# Patient Record
Sex: Male | Born: 1975 | Race: White | Hispanic: No | Marital: Single | Smoking: Current every day smoker
Health system: Southern US, Community
[De-identification: ages and names within clinical notes are randomized; demographics above are authoritative.]

## PROBLEM LIST (undated history)

## (undated) DIAGNOSIS — S73004A Unspecified dislocation of right hip, initial encounter: Secondary | ICD-10-CM

## (undated) DIAGNOSIS — F17200 Nicotine dependence, unspecified, uncomplicated: Secondary | ICD-10-CM

## (undated) DIAGNOSIS — S32461A Displaced associated transverse-posterior fracture of right acetabulum, initial encounter for closed fracture: Secondary | ICD-10-CM

## (undated) DIAGNOSIS — S82141A Displaced bicondylar fracture of right tibia, initial encounter for closed fracture: Secondary | ICD-10-CM

## (undated) DIAGNOSIS — I82451 Acute embolism and thrombosis of right peroneal vein: Secondary | ICD-10-CM

## (undated) HISTORY — PX: HEMORROIDECTOMY: SUR656

---

## 2001-10-10 ENCOUNTER — Encounter: Payer: Self-pay | Admitting: Emergency Medicine

## 2001-10-10 ENCOUNTER — Emergency Department (HOSPITAL_COMMUNITY): Admission: EM | Admit: 2001-10-10 | Discharge: 2001-10-10 | Payer: Self-pay | Admitting: Emergency Medicine

## 2001-10-13 ENCOUNTER — Emergency Department (HOSPITAL_COMMUNITY): Admission: EM | Admit: 2001-10-13 | Discharge: 2001-10-13 | Payer: Self-pay | Admitting: Internal Medicine

## 2001-10-18 ENCOUNTER — Emergency Department (HOSPITAL_COMMUNITY): Admission: EM | Admit: 2001-10-18 | Discharge: 2001-10-18 | Payer: Self-pay | Admitting: Emergency Medicine

## 2005-07-24 ENCOUNTER — Emergency Department (HOSPITAL_COMMUNITY): Admission: EM | Admit: 2005-07-24 | Discharge: 2005-07-24 | Payer: Self-pay | Admitting: Family Medicine

## 2011-07-27 ENCOUNTER — Emergency Department (HOSPITAL_COMMUNITY): Admission: EM | Admit: 2011-07-27 | Discharge: 2011-07-27 | Payer: Self-pay

## 2014-02-28 ENCOUNTER — Emergency Department (HOSPITAL_COMMUNITY)
Admission: EM | Admit: 2014-02-28 | Discharge: 2014-02-28 | Disposition: A | Discharge status: Home / Self Care | Payer: Worker's Compensation | Attending: Emergency Medicine | Admitting: Emergency Medicine

## 2014-02-28 ENCOUNTER — Encounter (HOSPITAL_COMMUNITY): Payer: Self-pay | Admitting: Emergency Medicine

## 2014-02-28 DIAGNOSIS — Z880 Allergy status to penicillin: Secondary | ICD-10-CM | POA: Diagnosis not present

## 2014-02-28 DIAGNOSIS — Z79899 Other long term (current) drug therapy: Secondary | ICD-10-CM | POA: Insufficient documentation

## 2014-02-28 DIAGNOSIS — T63441A Toxic effect of venom of bees, accidental (unintentional), initial encounter: Secondary | ICD-10-CM

## 2014-02-28 DIAGNOSIS — R0789 Other chest pain: Secondary | ICD-10-CM | POA: Insufficient documentation

## 2014-02-28 DIAGNOSIS — Y929 Unspecified place or not applicable: Secondary | ICD-10-CM | POA: Insufficient documentation

## 2014-02-28 DIAGNOSIS — T6391XA Toxic effect of contact with unspecified venomous animal, accidental (unintentional), initial encounter: Secondary | ICD-10-CM | POA: Insufficient documentation

## 2014-02-28 DIAGNOSIS — T63461A Toxic effect of venom of wasps, accidental (unintentional), initial encounter: Secondary | ICD-10-CM | POA: Diagnosis not present

## 2014-02-28 DIAGNOSIS — F17200 Nicotine dependence, unspecified, uncomplicated: Secondary | ICD-10-CM | POA: Insufficient documentation

## 2014-02-28 DIAGNOSIS — Y9389 Activity, other specified: Secondary | ICD-10-CM | POA: Insufficient documentation

## 2014-02-28 MED ORDER — PREDNISONE 50 MG PO TABS: 50.0000 mg | ORAL_TABLET | Freq: Every day | ORAL | Status: DC

## 2014-02-28 MED ORDER — FAMOTIDINE IN NACL 20-0.9 MG/50ML-% IV SOLN
20.0000 mg | Freq: Once | INTRAVENOUS | Status: AC
  Administered 2014-02-28: 20 mg via INTRAVENOUS
  Filled 2014-02-28: qty 50

## 2014-02-28 MED ORDER — ALBUTEROL SULFATE (2.5 MG/3ML) 0.083% IN NEBU
5.0000 mg | INHALATION_SOLUTION | Freq: Once | RESPIRATORY_TRACT | Status: AC
  Administered 2014-02-28: 5 mg via RESPIRATORY_TRACT
  Filled 2014-02-28: qty 6

## 2014-02-28 MED ORDER — METHYLPREDNISOLONE SODIUM SUCC 125 MG IJ SOLR
125.0000 mg | Freq: Once | INTRAMUSCULAR | Status: AC
  Administered 2014-02-28: 125 mg via INTRAVENOUS
  Filled 2014-02-28: qty 2

## 2014-02-28 MED ORDER — DIPHENHYDRAMINE HCL 50 MG/ML IJ SOLN
25.0000 mg | Freq: Once | INTRAMUSCULAR | Status: AC
  Administered 2014-02-28: 25 mg via INTRAVENOUS
  Filled 2014-02-28: qty 1

## 2014-02-28 MED ORDER — EPINEPHRINE 0.3 MG/0.3ML IJ SOAJ: 0.3000 mg | INTRAMUSCULAR | Status: AC | PRN

## 2014-02-28 MED ORDER — SODIUM CHLORIDE 0.9 % IV BOLUS (SEPSIS)
1000.0000 mL | Freq: Once | INTRAVENOUS | Status: AC
  Administered 2014-02-28: 1000 mL via INTRAVENOUS

## 2014-02-28 NOTE — ED Notes (Signed)
Pt was on the lawn mower and stung by a BEE. Pt used epi pen. sts he feels his chest and throat swelling up.

## 2014-02-28 NOTE — Discharge Instructions (Signed)
Take prednisone for 4 days.   Take benadryl 50 mg every 6 hrs as needed for itchiness.   Carry epi pen with you at all times. Use it when you feel that your throat closes up or you have trouble breathing.   Follow up with your doctor.   Return to ER if you have trouble breathing, throat closing, worse rash, fever.

## 2014-02-28 NOTE — ED Provider Notes (Signed)
CSN: [id number]     Arrival date & time 02/28/14  1113 History   First MD Initiated Contact with Patient 02/28/14 1120     Chief Complaint  Patient presents with  . Allergic Reaction     (Consider location/radiation/quality/duration/timing/severity/associated sxs/prior Treatment) The history is provided by the patient.  Daniel Franco Group is a 38 y.o. male here with possible anaphylaxis. He was mowing his lawn and was stung by some bees around 10:20 AM. He immediately felt that his throat was swelling up and he has chest tightness and develop a rash. He gave himself a shot of EpiPen right away. Now he feels better. Still feels some chest tightness but denies any trouble speaking or throat swelling.    History reviewed. No pertinent past medical history. History reviewed. No pertinent past surgical history. History reviewed. No pertinent family history. History  Substance Use Topics  . Smoking status: Current Every Day Smoker  . Smokeless tobacco: Not on file  . Alcohol Use: Not on file    Review of Systems  Respiratory: Positive for chest tightness.   All other systems reviewed and are negative.     Allergies  Bee venom and Penicillins  Home Medications   Prior to Admission medications   Medication Sig Start Date End Date Taking? Authorizing Provider  EPINEPHrine 0.3 mg/0.3 mL IJ SOAJ injection Inject 0.3 mg into the muscle once as needed (for allergic reaction).   Yes Historical Provider, MD  ranitidine (ZANTAC) 150 MG tablet Take 150 mg by mouth daily.   Yes Historical Provider, MD   BP 127/59  Pulse 73  Temp(Src) 97.5 F (36.4 C)  Resp 15  Wt 235 lb (106.595 kg)  SpO2 98% Physical Exam  Nursing note and vitals reviewed. Constitutional: He is oriented to person, place, and time. He appears well-nourished.  Slightly anxious   HENT:  Head: Normocephalic.  Mouth/Throat: Oropharynx is clear and moist.  Eyes: Conjunctivae are normal. Pupils are equal, round, and reactive  to light.  Neck: Normal range of motion. Neck supple.  No stridor   Cardiovascular: Normal rate, regular rhythm and normal heart sounds.   Pulmonary/Chest: Effort normal and breath sounds normal. No respiratory distress. He has no wheezes. He has no rales.  Abdominal: Soft. Bowel sounds are normal. He exhibits no distension. There is no tenderness. There is no rebound and no guarding.  Musculoskeletal: Normal range of motion. He exhibits no edema and no tenderness.  Neurological: He is alert and oriented to person, place, and time. No cranial nerve deficit. Coordination normal.  Skin: Skin is warm and dry.  Psychiatric: He has a normal mood and affect. His behavior is normal. Judgment and thought content normal.    ED Course  Procedures (including critical care time) Labs Review Labs Reviewed - No data to display  Imaging Review No results found.   EKG Interpretation None      MDM   Final diagnoses:  None    Daniel Franco Rambert is a 38 y.o. male here with possible anaphylaxis. No stridor and airway clear currently. Will observe for several hours, give steroids, benadryl.   2 PM  Observed for about 4 hrs from epi injection. No stridor. Feels better. No wheezing. Rash improved. Will refill epi pen and d/c home with course of steroids.    Richardean Canalavid H Yao, MD 02/28/14 [id number]

## 2017-12-19 ENCOUNTER — Emergency Department (HOSPITAL_COMMUNITY): Payer: 59

## 2017-12-19 ENCOUNTER — Other Ambulatory Visit: Payer: Self-pay

## 2017-12-19 ENCOUNTER — Inpatient Hospital Stay (HOSPITAL_COMMUNITY)
Admission: EM | Admit: 2017-12-19 | Discharge: 2017-12-28 | DRG: 493 | Disposition: A | Discharge status: Home Health | Payer: 59 | Attending: Orthopedic Surgery | Admitting: Orthopedic Surgery

## 2017-12-19 ENCOUNTER — Encounter (HOSPITAL_COMMUNITY): Payer: Self-pay | Admitting: *Deleted

## 2017-12-19 DIAGNOSIS — Z6829 Body mass index (BMI) 29.0-29.9, adult: Secondary | ICD-10-CM

## 2017-12-19 DIAGNOSIS — S32461A Displaced associated transverse-posterior fracture of right acetabulum, initial encounter for closed fracture: Principal | ICD-10-CM | POA: Diagnosis present

## 2017-12-19 DIAGNOSIS — Z880 Allergy status to penicillin: Secondary | ICD-10-CM

## 2017-12-19 DIAGNOSIS — S73004A Unspecified dislocation of right hip, initial encounter: Secondary | ICD-10-CM | POA: Diagnosis present

## 2017-12-19 DIAGNOSIS — S32401A Unspecified fracture of right acetabulum, initial encounter for closed fracture: Secondary | ICD-10-CM

## 2017-12-19 DIAGNOSIS — Y905 Blood alcohol level of 100-119 mg/100 ml: Secondary | ICD-10-CM | POA: Diagnosis present

## 2017-12-19 DIAGNOSIS — R059 Cough, unspecified: Secondary | ICD-10-CM

## 2017-12-19 DIAGNOSIS — F17200 Nicotine dependence, unspecified, uncomplicated: Secondary | ICD-10-CM | POA: Diagnosis present

## 2017-12-19 DIAGNOSIS — R05 Cough: Secondary | ICD-10-CM | POA: Diagnosis present

## 2017-12-19 DIAGNOSIS — D62 Acute posthemorrhagic anemia: Secondary | ICD-10-CM | POA: Diagnosis present

## 2017-12-19 DIAGNOSIS — F10929 Alcohol use, unspecified with intoxication, unspecified: Secondary | ICD-10-CM | POA: Diagnosis present

## 2017-12-19 DIAGNOSIS — S82144A Nondisplaced bicondylar fracture of right tibia, initial encounter for closed fracture: Secondary | ICD-10-CM | POA: Diagnosis present

## 2017-12-19 DIAGNOSIS — I82451 Acute embolism and thrombosis of right peroneal vein: Secondary | ICD-10-CM

## 2017-12-19 DIAGNOSIS — F17210 Nicotine dependence, cigarettes, uncomplicated: Secondary | ICD-10-CM | POA: Diagnosis present

## 2017-12-19 DIAGNOSIS — F10129 Alcohol abuse with intoxication, unspecified: Secondary | ICD-10-CM | POA: Diagnosis present

## 2017-12-19 DIAGNOSIS — I82491 Acute embolism and thrombosis of other specified deep vein of right lower extremity: Secondary | ICD-10-CM | POA: Diagnosis present

## 2017-12-19 DIAGNOSIS — Z419 Encounter for procedure for purposes other than remedying health state, unspecified: Secondary | ICD-10-CM

## 2017-12-19 DIAGNOSIS — Z91030 Bee allergy status: Secondary | ICD-10-CM

## 2017-12-19 DIAGNOSIS — T148XXA Other injury of unspecified body region, initial encounter: Secondary | ICD-10-CM

## 2017-12-19 DIAGNOSIS — S82141A Displaced bicondylar fracture of right tibia, initial encounter for closed fracture: Secondary | ICD-10-CM | POA: Diagnosis present

## 2017-12-19 DIAGNOSIS — S6992XA Unspecified injury of left wrist, hand and finger(s), initial encounter: Secondary | ICD-10-CM

## 2017-12-19 DIAGNOSIS — M25061 Hemarthrosis, right knee: Secondary | ICD-10-CM | POA: Diagnosis present

## 2017-12-19 HISTORY — DX: Unspecified dislocation of right hip, initial encounter: S73.004A

## 2017-12-19 HISTORY — DX: Displaced associated transverse-posterior fracture of right acetabulum, initial encounter for closed fracture: S32.461A

## 2017-12-19 HISTORY — DX: Acute embolism and thrombosis of right peroneal vein: I82.451

## 2017-12-19 HISTORY — DX: Displaced bicondylar fracture of right tibia, initial encounter for closed fracture: S82.141A

## 2017-12-19 HISTORY — DX: Nicotine dependence, unspecified, uncomplicated: F17.200

## 2017-12-19 LAB — COMPREHENSIVE METABOLIC PANEL
ALBUMIN: 4 g/dL (ref 3.5–5.0)
ALK PHOS: 55 U/L (ref 38–126)
ALT: 27 U/L (ref 17–63)
AST: 29 U/L (ref 15–41)
Anion gap: 13 (ref 5–15)
BILIRUBIN TOTAL: 0.6 mg/dL (ref 0.3–1.2)
BUN: 19 mg/dL (ref 6–20)
CO2: 22 mmol/L (ref 22–32)
Calcium: 9.3 mg/dL (ref 8.9–10.3)
Chloride: 104 mmol/L (ref 101–111)
Creatinine, Ser: 1.14 mg/dL (ref 0.61–1.24)
GFR calc non Af Amer: >60 mL/min (ref >60)
Glucose, Bld: 112 mg/dL — ABNORMAL HIGH (ref 65–99)
POTASSIUM: 3.4 mmol/L — AB (ref 3.5–5.1)
SODIUM: 139 mmol/L (ref 135–145)
Total Protein: 6.5 g/dL (ref 6.5–8.1)

## 2017-12-19 LAB — I-STAT CHEM 8, ED
BUN: 19 mg/dL (ref 6–20)
CHLORIDE: 102 mmol/L (ref 101–111)
Calcium, Ion: 1.17 mmol/L (ref 1.15–1.40)
Creatinine, Ser: 1.1 mg/dL (ref 0.61–1.24)
GLUCOSE: 107 mg/dL — AB (ref 65–99)
HCT: 43 % (ref 39.0–52.0)
Hemoglobin: 14.6 g/dL (ref 13.0–17.0)
Potassium: 3.2 mmol/L — ABNORMAL LOW (ref 3.5–5.1)
SODIUM: 141 mmol/L (ref 135–145)
TCO2: 23 mmol/L (ref 22–32)

## 2017-12-19 LAB — CBC
HCT: 42.9 % (ref 39.0–52.0)
HEMOGLOBIN: 14.6 g/dL (ref 13.0–17.0)
MCH: 31.7 pg (ref 26.0–34.0)
MCHC: 34 g/dL (ref 30.0–36.0)
MCV: 93.1 fL (ref 78.0–100.0)
Platelets: 326 10*3/uL (ref 150–400)
RBC: 4.61 MIL/uL (ref 4.22–5.81)
RDW: 12.9 % (ref 11.5–15.5)
WBC: 19.4 10*3/uL — ABNORMAL HIGH (ref 4.0–10.5)

## 2017-12-19 LAB — I-STAT CG4 LACTIC ACID, ED: Lactic Acid, Venous: 3.17 mmol/L (ref 0.5–1.9)

## 2017-12-19 LAB — ETHANOL: Alcohol, Ethyl (B): 104 mg/dL — ABNORMAL HIGH (ref <10)

## 2017-12-19 LAB — CDS SEROLOGY

## 2017-12-19 LAB — SAMPLE TO BLOOD BANK

## 2017-12-19 LAB — PROTIME-INR
INR: 0.94
PROTHROMBIN TIME: 12.5 s (ref 11.4–15.2)

## 2017-12-19 MED ORDER — HYDROMORPHONE HCL 2 MG/ML IJ SOLN: 1.0000 mg | Freq: Once | INTRAMUSCULAR | Status: DC

## 2017-12-19 MED ORDER — KETAMINE HCL 10 MG/ML IJ SOLN
INTRAMUSCULAR | Status: AC | PRN
  Administered 2017-12-19: 100 mg via INTRAVENOUS

## 2017-12-19 MED ORDER — PROPOFOL 1000 MG/100ML IV EMUL
INTRAVENOUS | Status: AC | PRN
  Administered 2017-12-19: 100 ug/kg/min via INTRAVENOUS

## 2017-12-19 MED ORDER — FENTANYL CITRATE (PF) 100 MCG/2ML IJ SOLN
50.0000 ug | Freq: Once | INTRAMUSCULAR | Status: AC
  Administered 2017-12-19: 50 ug via INTRAVENOUS

## 2017-12-19 MED ORDER — PROPOFOL 10 MG/ML IV BOLUS
1.0000 mg/kg | Freq: Once | INTRAVENOUS | Status: AC
  Administered 2017-12-19: 104.3 mg via INTRAVENOUS

## 2017-12-19 MED ORDER — KETAMINE HCL 10 MG/ML IJ SOLN
1.0000 mg/kg | Freq: Once | INTRAMUSCULAR | Status: AC
  Administered 2017-12-19: 104 mg via INTRAVENOUS
  Filled 2017-12-19: qty 1

## 2017-12-19 MED ORDER — FENTANYL CITRATE (PF) 100 MCG/2ML IJ SOLN
INTRAMUSCULAR | Status: AC
  Filled 2017-12-19: qty 2

## 2017-12-19 MED ORDER — PROPOFOL 1000 MG/100ML IV EMUL
INTRAVENOUS | Status: AC
  Filled 2017-12-19: qty 100

## 2017-12-19 MED ORDER — HYDROMORPHONE HCL 2 MG/ML IJ SOLN
1.0000 mg | Freq: Once | INTRAMUSCULAR | Status: AC
  Administered 2017-12-19: 1 mg via INTRAVENOUS
  Filled 2017-12-19: qty 1

## 2017-12-19 MED ORDER — KETAMINE HCL 10 MG/ML IJ SOLN
0.3000 mg/kg | Freq: Once | INTRAMUSCULAR | Status: AC
  Administered 2017-12-19: 31 mg via INTRAVENOUS
  Filled 2017-12-19: qty 1

## 2017-12-19 NOTE — ED Notes (Signed)
Patient transported to X-ray 
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
## 2017-12-20 ENCOUNTER — Emergency Department (HOSPITAL_COMMUNITY): Payer: 59

## 2017-12-20 ENCOUNTER — Inpatient Hospital Stay (HOSPITAL_COMMUNITY): Payer: 59

## 2017-12-20 ENCOUNTER — Encounter (HOSPITAL_COMMUNITY): Payer: Self-pay | Admitting: General Practice

## 2017-12-20 ENCOUNTER — Other Ambulatory Visit: Payer: Self-pay

## 2017-12-20 DIAGNOSIS — S82141A Displaced bicondylar fracture of right tibia, initial encounter for closed fracture: Secondary | ICD-10-CM

## 2017-12-20 DIAGNOSIS — I82491 Acute embolism and thrombosis of other specified deep vein of right lower extremity: Secondary | ICD-10-CM | POA: Diagnosis present

## 2017-12-20 DIAGNOSIS — S32461A Displaced associated transverse-posterior fracture of right acetabulum, initial encounter for closed fracture: Secondary | ICD-10-CM

## 2017-12-20 DIAGNOSIS — F10129 Alcohol abuse with intoxication, unspecified: Secondary | ICD-10-CM | POA: Diagnosis present

## 2017-12-20 DIAGNOSIS — Y905 Blood alcohol level of 100-119 mg/100 ml: Secondary | ICD-10-CM | POA: Diagnosis present

## 2017-12-20 DIAGNOSIS — M25061 Hemarthrosis, right knee: Secondary | ICD-10-CM | POA: Diagnosis present

## 2017-12-20 DIAGNOSIS — S82144A Nondisplaced bicondylar fracture of right tibia, initial encounter for closed fracture: Secondary | ICD-10-CM | POA: Diagnosis present

## 2017-12-20 DIAGNOSIS — Z91030 Bee allergy status: Secondary | ICD-10-CM | POA: Diagnosis not present

## 2017-12-20 DIAGNOSIS — M79609 Pain in unspecified limb: Secondary | ICD-10-CM | POA: Diagnosis not present

## 2017-12-20 DIAGNOSIS — S73004A Unspecified dislocation of right hip, initial encounter: Secondary | ICD-10-CM | POA: Diagnosis present

## 2017-12-20 DIAGNOSIS — Z880 Allergy status to penicillin: Secondary | ICD-10-CM | POA: Diagnosis not present

## 2017-12-20 DIAGNOSIS — D62 Acute posthemorrhagic anemia: Secondary | ICD-10-CM | POA: Diagnosis present

## 2017-12-20 DIAGNOSIS — F17210 Nicotine dependence, cigarettes, uncomplicated: Secondary | ICD-10-CM | POA: Diagnosis present

## 2017-12-20 DIAGNOSIS — R05 Cough: Secondary | ICD-10-CM | POA: Diagnosis present

## 2017-12-20 DIAGNOSIS — S32461D Displaced associated transverse-posterior fracture of right acetabulum, subsequent encounter for fracture with routine healing: Secondary | ICD-10-CM | POA: Diagnosis not present

## 2017-12-20 DIAGNOSIS — Z7409 Other reduced mobility: Secondary | ICD-10-CM | POA: Diagnosis not present

## 2017-12-20 DIAGNOSIS — Z6829 Body mass index (BMI) 29.0-29.9, adult: Secondary | ICD-10-CM | POA: Diagnosis not present

## 2017-12-20 HISTORY — DX: Displaced bicondylar fracture of right tibia, initial encounter for closed fracture: S82.141A

## 2017-12-20 HISTORY — DX: Displaced associated transverse-posterior fracture of right acetabulum, initial encounter for closed fracture: S32.461A

## 2017-12-20 LAB — CBC
HCT: 39.6 % (ref 39.0–52.0)
Hemoglobin: 13.5 g/dL (ref 13.0–17.0)
MCH: 32.1 pg (ref 26.0–34.0)
MCHC: 34.1 g/dL (ref 30.0–36.0)
MCV: 94.3 fL (ref 78.0–100.0)
PLATELETS: 250 10*3/uL (ref 150–400)
RBC: 4.2 MIL/uL — AB (ref 4.22–5.81)
RDW: 12.9 % (ref 11.5–15.5)
WBC: 16.7 10*3/uL — AB (ref 4.0–10.5)

## 2017-12-20 LAB — URINALYSIS, ROUTINE W REFLEX MICROSCOPIC
Bacteria, UA: NONE SEEN
Bilirubin Urine: NEGATIVE
Glucose, UA: 150 mg/dL — AB
KETONES UR: 5 mg/dL — AB
LEUKOCYTES UA: NEGATIVE
Nitrite: NEGATIVE
PH: 5 (ref 5.0–8.0)
PROTEIN: NEGATIVE mg/dL
Specific Gravity, Urine: 1.017 (ref 1.005–1.030)

## 2017-12-20 LAB — CREATININE, SERUM: CREATININE: 0.93 mg/dL (ref 0.61–1.24)

## 2017-12-20 MED ORDER — METHOCARBAMOL 500 MG PO TABS
500.0000 mg | ORAL_TABLET | Freq: Four times a day (QID) | ORAL | Status: DC | PRN
  Administered 2017-12-20 – 2017-12-21 (×3): 500 mg via ORAL
  Filled 2017-12-20 (×4): qty 1

## 2017-12-20 MED ORDER — HYDROMORPHONE HCL 2 MG/ML IJ SOLN
1.0000 mg | INTRAMUSCULAR | Status: DC | PRN
  Administered 2017-12-20 – 2017-12-21 (×9): 1 mg via INTRAVENOUS
  Filled 2017-12-20 (×8): qty 1

## 2017-12-20 MED ORDER — ACETAMINOPHEN 325 MG PO TABS
650.0000 mg | ORAL_TABLET | Freq: Four times a day (QID) | ORAL | Status: DC | PRN
  Filled 2017-12-20: qty 2

## 2017-12-20 MED ORDER — LACTATED RINGERS IV SOLN
INTRAVENOUS | Status: DC
  Administered 2017-12-20: 04:00:00 via INTRAVENOUS

## 2017-12-20 MED ORDER — ENOXAPARIN SODIUM 40 MG/0.4ML ~~LOC~~ SOLN
40.0000 mg | SUBCUTANEOUS | Status: AC
  Administered 2017-12-20: 40 mg via SUBCUTANEOUS
  Filled 2017-12-20: qty 0.4

## 2017-12-20 MED ORDER — DIPHENHYDRAMINE HCL 25 MG PO CAPS
25.0000 mg | ORAL_CAPSULE | Freq: Three times a day (TID) | ORAL | Status: DC | PRN
  Administered 2017-12-20: 25 mg via ORAL
  Filled 2017-12-20: qty 1

## 2017-12-20 MED ORDER — OXYCODONE HCL 5 MG PO TABS
5.0000 mg | ORAL_TABLET | ORAL | Status: DC | PRN
  Administered 2017-12-20 (×3): 10 mg via ORAL
  Administered 2017-12-20: 5 mg via ORAL
  Administered 2017-12-21 (×2): 10 mg via ORAL
  Filled 2017-12-20: qty 1
  Filled 2017-12-20 (×5): qty 2

## 2017-12-20 MED ORDER — ACETAMINOPHEN 650 MG RE SUPP: 650.0000 mg | Freq: Four times a day (QID) | RECTAL | Status: DC | PRN

## 2017-12-20 MED ORDER — SENNA 8.6 MG PO TABS
1.0000 | ORAL_TABLET | Freq: Two times a day (BID) | ORAL | Status: DC
  Administered 2017-12-20 – 2017-12-28 (×16): 8.6 mg via ORAL
  Filled 2017-12-20 (×16): qty 1

## 2017-12-20 MED ORDER — ONDANSETRON HCL 4 MG PO TABS: 4.0000 mg | ORAL_TABLET | Freq: Four times a day (QID) | ORAL | Status: DC | PRN

## 2017-12-20 MED ORDER — DEXTROSE 5 % IV SOLN
500.0000 mg | Freq: Four times a day (QID) | INTRAVENOUS | Status: DC | PRN
  Filled 2017-12-20: qty 5

## 2017-12-20 MED ORDER — ONDANSETRON HCL 4 MG/2ML IJ SOLN: 4.0000 mg | Freq: Four times a day (QID) | INTRAMUSCULAR | Status: DC | PRN

## 2017-12-20 MED ORDER — ACETAMINOPHEN 500 MG PO TABS
1000.0000 mg | ORAL_TABLET | Freq: Four times a day (QID) | ORAL | Status: DC
  Administered 2017-12-20 – 2017-12-21 (×6): 1000 mg via ORAL
  Filled 2017-12-20 (×6): qty 2

## 2017-12-20 NOTE — Sedation Documentation (Signed)
pts family at the bedside t keeping both eyes closed but answers questions appropriately.  C/o extreme pain  In his rt hip  Post reduction  Post reduction hi portable xray  Dr Aundria Rud at the bedside.  Ice packs to the rt hip ortho tech has placed  A knee immobilizer onto his rt ankle
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
## 2017-12-20 NOTE — Progress Notes (Signed)
RT at bedside for conscious sedation  

## 2017-12-20 NOTE — Progress Notes (Signed)
Patient is complaining of having heartburn. Patient states he would like a medication prescribed. Patient denies any other symptoms. Will continue to monitor.

## 2017-12-20 NOTE — ED Notes (Signed)
Report called to rn on 5n  Will go up whenever he returns from c-t
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
## 2017-12-20 NOTE — Progress Notes (Signed)
Patient complains of more pain in left wrist. There is slight swelling on top of hand and by right wrist. Will relay in report to day shift nurse.Right hand grip is weaker than the left due to pain.

## 2017-12-20 NOTE — Progress Notes (Signed)
Pt arrived on 5N via stretcher from ED.

## 2017-12-20 NOTE — Progress Notes (Signed)
1900: Handoff report received from RN. Pt resting with girlfriend at bedside. Discussed plan of care for the shift; pt amenable to plan. Pt c/o itching and requesting benadryl. On call ortho provider paged.  2000: Ortho PA Dixon ordered benadryl.  0000: Pt reporting poor pain control despite frequent interventions.  0400: Pt continues with poor pain control and refuses any nonpharmacological pain interventions (repositioning, heat, ice, etc.).  0700: Handoff report given to RN. No acute events overnight.

## 2017-12-20 NOTE — Progress Notes (Signed)
Pt has refused to be repositioned in bed and refused to be touched by staff by yelling, "No, don't touch me."  Attempted to pre-medicate pt with prn meds prior to readjustment.  Pt yelling at family, staff and friends in the room when plan of care is explained. Pt threatened to smoke a cigarette in the room although informed of danger and family verbalized not allowing pt to have access to cigarettes. Pt has refused to eat any of his meals. Pt states "I'm not eating shit from this hospital and stop trying to make me.  I'm pissed and I don't know why this had to happen to me.  Family requested to be present and assist with bed bath and linen change.

## 2017-12-21 ENCOUNTER — Inpatient Hospital Stay (HOSPITAL_COMMUNITY): Payer: 59 | Admitting: Certified Registered Nurse Anesthetist

## 2017-12-21 ENCOUNTER — Inpatient Hospital Stay (HOSPITAL_COMMUNITY): Payer: 59

## 2017-12-21 ENCOUNTER — Encounter (HOSPITAL_COMMUNITY)
Admission: EM | Disposition: A | Discharge status: Home Health | Payer: Self-pay | Source: Home / Self Care | Attending: Orthopedic Surgery

## 2017-12-21 HISTORY — PX: ORIF ACETABULAR FRACTURE: SHX5029

## 2017-12-21 HISTORY — PX: ORIF TIBIA PLATEAU: SHX2132

## 2017-12-21 LAB — COMPREHENSIVE METABOLIC PANEL
ALK PHOS: 50 U/L (ref 38–126)
ALT: 31 U/L (ref 17–63)
AST: 54 U/L — AB (ref 15–41)
Albumin: 3.4 g/dL — ABNORMAL LOW (ref 3.5–5.0)
Anion gap: 5 (ref 5–15)
BUN: 14 mg/dL (ref 6–20)
CALCIUM: 8.5 mg/dL — AB (ref 8.9–10.3)
CO2: 28 mmol/L (ref 22–32)
CREATININE: 0.94 mg/dL (ref 0.61–1.24)
Chloride: 103 mmol/L (ref 101–111)
Glucose, Bld: 107 mg/dL — ABNORMAL HIGH (ref 65–99)
Potassium: 4 mmol/L (ref 3.5–5.1)
SODIUM: 136 mmol/L (ref 135–145)
Total Bilirubin: 1.2 mg/dL (ref 0.3–1.2)
Total Protein: 5.7 g/dL — ABNORMAL LOW (ref 6.5–8.1)

## 2017-12-21 LAB — CBC
HCT: 37.9 % — ABNORMAL LOW (ref 39.0–52.0)
HCT: 37.8 % — ABNORMAL LOW (ref 39.0–52.0)
Hemoglobin: 12.8 g/dL — ABNORMAL LOW (ref 13.0–17.0)
Hemoglobin: 12.9 g/dL — ABNORMAL LOW (ref 13.0–17.0)
MCH: 31.9 pg (ref 26.0–34.0)
MCH: 32 pg (ref 26.0–34.0)
MCHC: 33.8 g/dL (ref 30.0–36.0)
MCHC: 34.1 g/dL (ref 30.0–36.0)
MCV: 94.5 fL (ref 78.0–100.0)
MCV: 93.8 fL (ref 78.0–100.0)
PLATELETS: 189 10*3/uL (ref 150–400)
PLATELETS: 237 10*3/uL (ref 150–400)
RBC: 4.01 MIL/uL — ABNORMAL LOW (ref 4.22–5.81)
RBC: 4.03 MIL/uL — AB (ref 4.22–5.81)
RDW: 12.9 % (ref 11.5–15.5)
RDW: 12.5 % (ref 11.5–15.5)
WBC: 9.9 10*3/uL (ref 4.0–10.5)
WBC: 13.8 10*3/uL — ABNORMAL HIGH (ref 4.0–10.5)

## 2017-12-21 LAB — RAPID URINE DRUG SCREEN, HOSP PERFORMED
Amphetamines: NOT DETECTED
BENZODIAZEPINES: NOT DETECTED
Barbiturates: NOT DETECTED
COCAINE: NOT DETECTED
Opiates: POSITIVE — AB
Tetrahydrocannabinol: NOT DETECTED

## 2017-12-21 LAB — TYPE AND SCREEN
ABO/RH(D): A POS
Antibody Screen: NEGATIVE

## 2017-12-21 LAB — PROTIME-INR
INR: 1.09
Prothrombin Time: 14.1 seconds (ref 11.4–15.2)

## 2017-12-21 LAB — CREATININE, SERUM
CREATININE: 0.95 mg/dL (ref 0.61–1.24)
GFR calc Af Amer: >60 mL/min (ref >60)
GFR calc non Af Amer: >60 mL/min (ref >60)

## 2017-12-21 LAB — HIV ANTIBODY (ROUTINE TESTING W REFLEX): HIV SCREEN 4TH GENERATION: NONREACTIVE

## 2017-12-21 LAB — LACTIC ACID, PLASMA: Lactic Acid, Venous: 0.7 mmol/L (ref 0.5–1.9)

## 2017-12-21 LAB — ABO/RH: ABO/RH(D): A POS

## 2017-12-21 SURGERY — OPEN REDUCTION INTERNAL FIXATION (ORIF) ACETABULAR FRACTURE
Anesthesia: General | Site: Knee | Laterality: Right

## 2017-12-21 MED ORDER — LIDOCAINE 2% (20 MG/ML) 5 ML SYRINGE
INTRAMUSCULAR | Status: DC | PRN
  Administered 2017-12-21: 100 mg via INTRAVENOUS

## 2017-12-21 MED ORDER — MIDAZOLAM HCL 2 MG/2ML IJ SOLN
INTRAMUSCULAR | Status: AC
  Filled 2017-12-21: qty 2

## 2017-12-21 MED ORDER — LACTATED RINGERS IV SOLN
INTRAVENOUS | Status: DC | PRN
  Administered 2017-12-21 (×3): via INTRAVENOUS

## 2017-12-21 MED ORDER — ONDANSETRON HCL 4 MG/2ML IJ SOLN: 4.0000 mg | Freq: Four times a day (QID) | INTRAMUSCULAR | Status: DC | PRN

## 2017-12-21 MED ORDER — POLYETHYLENE GLYCOL 3350 17 G PO PACK
17.0000 g | PACK | Freq: Every day | ORAL | Status: DC
  Administered 2017-12-24: 17 g via ORAL
  Filled 2017-12-21 (×4): qty 1

## 2017-12-21 MED ORDER — METHOCARBAMOL 500 MG PO TABS: 1000.0000 mg | ORAL_TABLET | Freq: Three times a day (TID) | ORAL | Status: DC

## 2017-12-21 MED ORDER — HYDROMORPHONE HCL 2 MG/ML IJ SOLN: 0.2500 mg | INTRAMUSCULAR | Status: DC | PRN

## 2017-12-21 MED ORDER — ACETAMINOPHEN 325 MG PO TABS
ORAL_TABLET | ORAL | Status: AC
  Filled 2017-12-21: qty 2

## 2017-12-21 MED ORDER — HYDROMORPHONE HCL 1 MG/ML IJ SOLN
INTRAMUSCULAR | Status: DC | PRN
  Administered 2017-12-21: 1 mg via INTRAVENOUS

## 2017-12-21 MED ORDER — PROPOFOL 10 MG/ML IV BOLUS
INTRAVENOUS | Status: DC | PRN
  Administered 2017-12-21: 20 mg via INTRAVENOUS
  Administered 2017-12-21: 180 mg via INTRAVENOUS

## 2017-12-21 MED ORDER — HYDROXYZINE HCL 25 MG PO TABS: 25.0000 mg | ORAL_TABLET | Freq: Three times a day (TID) | ORAL | Status: DC | PRN

## 2017-12-21 MED ORDER — HYDROCODONE-ACETAMINOPHEN 7.5-325 MG PO TABS: 1.0000 | ORAL_TABLET | Freq: Once | ORAL | Status: DC | PRN

## 2017-12-21 MED ORDER — MIDAZOLAM HCL 2 MG/2ML IJ SOLN
INTRAMUSCULAR | Status: DC | PRN
  Administered 2017-12-21: 2 mg via INTRAVENOUS

## 2017-12-21 MED ORDER — FENTANYL CITRATE (PF) 250 MCG/5ML IJ SOLN
INTRAMUSCULAR | Status: AC
  Filled 2017-12-21: qty 5

## 2017-12-21 MED ORDER — METHOCARBAMOL 1000 MG/10ML IJ SOLN
1000.0000 mg | Freq: Four times a day (QID) | INTRAVENOUS | Status: DC | PRN
  Filled 2017-12-21: qty 10

## 2017-12-21 MED ORDER — ENOXAPARIN SODIUM 40 MG/0.4ML ~~LOC~~ SOLN
40.0000 mg | SUBCUTANEOUS | Status: DC
  Administered 2017-12-22 – 2017-12-27 (×6): 40 mg via SUBCUTANEOUS
  Filled 2017-12-21 (×6): qty 0.4

## 2017-12-21 MED ORDER — GABAPENTIN 300 MG PO CAPS
300.0000 mg | ORAL_CAPSULE | Freq: Once | ORAL | Status: AC
  Administered 2017-12-21: 300 mg via ORAL

## 2017-12-21 MED ORDER — HYDROCODONE-ACETAMINOPHEN 10-325 MG PO TABS
1.0000 | ORAL_TABLET | Freq: Four times a day (QID) | ORAL | Status: DC | PRN
  Administered 2017-12-21 (×2): 2 via ORAL
  Administered 2017-12-22: 1 via ORAL
  Administered 2017-12-22 – 2017-12-24 (×7): 2 via ORAL
  Filled 2017-12-21 (×5): qty 2
  Filled 2017-12-21: qty 1
  Filled 2017-12-21 (×4): qty 2

## 2017-12-21 MED ORDER — DEXTROSE 5 % IV SOLN
500.0000 mg | Freq: Once | INTRAVENOUS | Status: DC
  Filled 2017-12-21 (×2): qty 5

## 2017-12-21 MED ORDER — CHLORHEXIDINE GLUCONATE CLOTH 2 % EX PADS
6.0000 | MEDICATED_PAD | Freq: Once | CUTANEOUS | Status: AC
  Administered 2017-12-21: 6 via TOPICAL

## 2017-12-21 MED ORDER — SUGAMMADEX SODIUM 200 MG/2ML IV SOLN
INTRAVENOUS | Status: DC | PRN
  Administered 2017-12-21: 200 mg via INTRAVENOUS

## 2017-12-21 MED ORDER — SODIUM CHLORIDE 0.9% FLUSH: 9.0000 mL | INTRAVENOUS | Status: DC | PRN

## 2017-12-21 MED ORDER — METOCLOPRAMIDE HCL 5 MG PO TABS: 5.0000 mg | ORAL_TABLET | Freq: Three times a day (TID) | ORAL | Status: DC | PRN

## 2017-12-21 MED ORDER — METHOCARBAMOL 500 MG PO TABS
1000.0000 mg | ORAL_TABLET | Freq: Four times a day (QID) | ORAL | Status: DC | PRN
  Administered 2017-12-21 – 2017-12-22 (×2): 1000 mg via ORAL
  Filled 2017-12-21 (×2): qty 2

## 2017-12-21 MED ORDER — SODIUM CHLORIDE 0.9 % IR SOLN
Status: DC | PRN
  Administered 2017-12-21: 3000 mL

## 2017-12-21 MED ORDER — LACTATED RINGERS IV SOLN: INTRAVENOUS | Status: DC

## 2017-12-21 MED ORDER — PHENYLEPHRINE HCL 10 MG/ML IJ SOLN
INTRAVENOUS | Status: DC | PRN
  Administered 2017-12-21: 25 ug/min via INTRAVENOUS

## 2017-12-21 MED ORDER — KETOROLAC TROMETHAMINE 30 MG/ML IJ SOLN
30.0000 mg | INTRAMUSCULAR | Status: AC
  Administered 2017-12-21: 30 mg via INTRAVENOUS
  Filled 2017-12-21: qty 1

## 2017-12-21 MED ORDER — PROMETHAZINE HCL 25 MG/ML IJ SOLN: 6.2500 mg | INTRAMUSCULAR | Status: DC | PRN

## 2017-12-21 MED ORDER — ONDANSETRON HCL 4 MG/2ML IJ SOLN
INTRAMUSCULAR | Status: DC | PRN
  Administered 2017-12-21: 4 mg via INTRAVENOUS

## 2017-12-21 MED ORDER — HYDROMORPHONE 1 MG/ML IV SOLN
INTRAVENOUS | Status: DC
  Administered 2017-12-21: 23:00:00 via INTRAVENOUS
  Administered 2017-12-22: 2.5 mg via INTRAVENOUS
  Administered 2017-12-22: 4.5 mg via INTRAVENOUS
  Filled 2017-12-21: qty 25

## 2017-12-21 MED ORDER — PHENYLEPHRINE HCL 10 MG/ML IJ SOLN
INTRAMUSCULAR | Status: DC | PRN
  Administered 2017-12-21 (×5): 80 ug via INTRAVENOUS

## 2017-12-21 MED ORDER — DOCUSATE SODIUM 100 MG PO CAPS
100.0000 mg | ORAL_CAPSULE | Freq: Two times a day (BID) | ORAL | Status: DC
  Administered 2017-12-21 – 2017-12-28 (×14): 100 mg via ORAL
  Filled 2017-12-21 (×14): qty 1

## 2017-12-21 MED ORDER — PANTOPRAZOLE SODIUM 40 MG PO TBEC
40.0000 mg | DELAYED_RELEASE_TABLET | Freq: Every day | ORAL | Status: DC
  Administered 2017-12-22 – 2017-12-28 (×7): 40 mg via ORAL
  Filled 2017-12-21 (×8): qty 1

## 2017-12-21 MED ORDER — ACETAMINOPHEN 325 MG PO TABS: 650.0000 mg | ORAL_TABLET | Freq: Once | ORAL | Status: DC

## 2017-12-21 MED ORDER — PHENYLEPHRINE 40 MCG/ML (10ML) SYRINGE FOR IV PUSH (FOR BLOOD PRESSURE SUPPORT)
PREFILLED_SYRINGE | INTRAVENOUS | Status: AC
  Filled 2017-12-21: qty 20

## 2017-12-21 MED ORDER — CLONIDINE HCL 0.2 MG PO TABS
ORAL_TABLET | ORAL | Status: AC
  Administered 2017-12-21: 0.2 mg via ORAL
  Filled 2017-12-21: qty 1

## 2017-12-21 MED ORDER — PROPOFOL 10 MG/ML IV BOLUS
INTRAVENOUS | Status: AC
  Filled 2017-12-21: qty 20

## 2017-12-21 MED ORDER — FENTANYL CITRATE (PF) 250 MCG/5ML IJ SOLN
INTRAMUSCULAR | Status: DC | PRN
  Administered 2017-12-21: 50 ug via INTRAVENOUS
  Administered 2017-12-21: 100 ug via INTRAVENOUS

## 2017-12-21 MED ORDER — CEFAZOLIN SODIUM-DEXTROSE 1-4 GM/50ML-% IV SOLN
1.0000 g | Freq: Four times a day (QID) | INTRAVENOUS | Status: AC
  Administered 2017-12-22 (×3): 1 g via INTRAVENOUS
  Filled 2017-12-21 (×3): qty 50

## 2017-12-21 MED ORDER — MEPERIDINE HCL 50 MG/ML IJ SOLN: 6.2500 mg | INTRAMUSCULAR | Status: DC | PRN

## 2017-12-21 MED ORDER — NALOXONE HCL 0.4 MG/ML IJ SOLN: 0.4000 mg | INTRAMUSCULAR | Status: DC | PRN

## 2017-12-21 MED ORDER — ROCURONIUM BROMIDE 10 MG/ML (PF) SYRINGE
PREFILLED_SYRINGE | INTRAVENOUS | Status: DC | PRN
  Administered 2017-12-21 (×2): 30 mg via INTRAVENOUS
  Administered 2017-12-21: 40 mg via INTRAVENOUS
  Administered 2017-12-21: 50 mg via INTRAVENOUS
  Administered 2017-12-21: 30 mg via INTRAVENOUS
  Administered 2017-12-21: 20 mg via INTRAVENOUS
  Administered 2017-12-21: 30 mg via INTRAVENOUS

## 2017-12-21 MED ORDER — CEFAZOLIN SODIUM-DEXTROSE 2-4 GM/100ML-% IV SOLN
2.0000 g | INTRAVENOUS | Status: AC
  Administered 2017-12-21 (×2): 2 g via INTRAVENOUS
  Filled 2017-12-21: qty 100

## 2017-12-21 MED ORDER — HYDROMORPHONE HCL 1 MG/ML IJ SOLN
INTRAMUSCULAR | Status: AC
  Filled 2017-12-21: qty 1

## 2017-12-21 MED ORDER — ACETAMINOPHEN 10 MG/ML IV SOLN: 1000.0000 mg | Freq: Once | INTRAVENOUS | Status: DC | PRN

## 2017-12-21 MED ORDER — DIPHENHYDRAMINE HCL 12.5 MG/5ML PO ELIX: 12.5000 mg | ORAL_SOLUTION | Freq: Four times a day (QID) | ORAL | Status: DC | PRN

## 2017-12-21 MED ORDER — DEXAMETHASONE SODIUM PHOSPHATE 10 MG/ML IJ SOLN
INTRAMUSCULAR | Status: DC | PRN
  Administered 2017-12-21: 10 mg via INTRAVENOUS

## 2017-12-21 MED ORDER — METOCLOPRAMIDE HCL 5 MG/ML IJ SOLN: 5.0000 mg | Freq: Three times a day (TID) | INTRAMUSCULAR | Status: DC | PRN

## 2017-12-21 MED ORDER — GABAPENTIN 300 MG PO CAPS
ORAL_CAPSULE | ORAL | Status: AC
  Administered 2017-12-21: 300 mg via ORAL
  Filled 2017-12-21: qty 1

## 2017-12-21 MED ORDER — MORPHINE SULFATE (PF) 2 MG/ML IV SOLN
2.0000 mg | INTRAVENOUS | Status: DC | PRN
  Administered 2017-12-21: 3 mg via INTRAVENOUS
  Filled 2017-12-21: qty 2

## 2017-12-21 MED ORDER — HYDROXYZINE HCL 25 MG PO TABS
50.0000 mg | ORAL_TABLET | Freq: Once | ORAL | Status: AC
  Administered 2017-12-21: 50 mg via ORAL
  Filled 2017-12-21: qty 2

## 2017-12-21 MED ORDER — ONDANSETRON HCL 4 MG PO TABS: 4.0000 mg | ORAL_TABLET | Freq: Four times a day (QID) | ORAL | Status: DC | PRN

## 2017-12-21 MED ORDER — GABAPENTIN 600 MG PO TABS
300.0000 mg | ORAL_TABLET | Freq: Two times a day (BID) | ORAL | Status: DC
  Administered 2017-12-21: 300 mg via ORAL
  Filled 2017-12-21: qty 1

## 2017-12-21 MED ORDER — POTASSIUM CHLORIDE IN NACL 20-0.9 MEQ/L-% IV SOLN
INTRAVENOUS | Status: DC
  Administered 2017-12-21 – 2017-12-27 (×12): via INTRAVENOUS
  Filled 2017-12-21 (×14): qty 1000

## 2017-12-21 MED ORDER — METHOCARBAMOL 1000 MG/10ML IJ SOLN: 1000.0000 mg | Freq: Three times a day (TID) | INTRAVENOUS | Status: DC

## 2017-12-21 MED ORDER — 0.9 % SODIUM CHLORIDE (POUR BTL) OPTIME
TOPICAL | Status: DC | PRN
  Administered 2017-12-21: 1000 mL

## 2017-12-21 MED ORDER — DIPHENHYDRAMINE HCL 50 MG/ML IJ SOLN: 12.5000 mg | Freq: Four times a day (QID) | INTRAMUSCULAR | Status: DC | PRN

## 2017-12-21 MED ORDER — CLONIDINE HCL 0.2 MG PO TABS
0.2000 mg | ORAL_TABLET | Freq: Once | ORAL | Status: AC
  Administered 2017-12-21: 0.2 mg via ORAL

## 2017-12-21 MED ORDER — ROCURONIUM BROMIDE 10 MG/ML (PF) SYRINGE
PREFILLED_SYRINGE | INTRAVENOUS | Status: AC
  Filled 2017-12-21: qty 5

## 2017-12-21 SURGICAL SUPPLY — 122 items
ANCHOR JUGGERKNOT 2.9 (Anchor) ×3 IMPLANT
ANCHOR JUGGERKNOT 2.9MM (Anchor) ×1 IMPLANT
BANDAGE ACE 4X5 VEL STRL LF (GAUZE/BANDAGES/DRESSINGS) ×4 IMPLANT
BANDAGE ACE 6X5 VEL STRL LF (GAUZE/BANDAGES/DRESSINGS) ×4 IMPLANT
BANDAGE ESMARK 6X9 LF (GAUZE/BANDAGES/DRESSINGS) ×2 IMPLANT
BIT DRILL 2.5X2.75 QC CALB (BIT) ×4 IMPLANT
BIT DRILL AO MATTA 2.5MX230M (BIT) ×2 IMPLANT
BIT DRILL STEP 3.5 (DRILL) IMPLANT
BLADE CLIPPER SURG (BLADE) IMPLANT
BLADE SURG 10 STRL SS (BLADE) ×4 IMPLANT
BLADE SURG 15 STRL LF DISP TIS (BLADE) ×2 IMPLANT
BLADE SURG 15 STRL SS (BLADE) ×2
BNDG COHESIVE 4X5 TAN STRL (GAUZE/BANDAGES/DRESSINGS) ×4 IMPLANT
BNDG COHESIVE 6X5 TAN STRL LF (GAUZE/BANDAGES/DRESSINGS) ×4 IMPLANT
BNDG ESMARK 6X9 LF (GAUZE/BANDAGES/DRESSINGS) ×4
BNDG GAUZE ELAST 4 BULKY (GAUZE/BANDAGES/DRESSINGS) ×4 IMPLANT
BRUSH SCRUB SURG 4.25 DISP (MISCELLANEOUS) ×12 IMPLANT
CANISTER SUCT 3000ML PPV (MISCELLANEOUS) ×4 IMPLANT
CLOSURE WOUND 1/2 X4 (GAUZE/BANDAGES/DRESSINGS) ×1
COVER SURGICAL LIGHT HANDLE (MISCELLANEOUS) ×4 IMPLANT
CUFF TOURNIQUET SINGLE 34IN LL (TOURNIQUET CUFF) IMPLANT
DRAPE C-ARM 42X72 X-RAY (DRAPES) ×8 IMPLANT
DRAPE C-ARMOR (DRAPES) ×8 IMPLANT
DRAPE HALF SHEET 40X57 (DRAPES) IMPLANT
DRAPE INCISE IOBAN 66X45 STRL (DRAPES) ×4 IMPLANT
DRAPE INCISE IOBAN 85X60 (DRAPES) ×4 IMPLANT
DRAPE ORTHO SPLIT 77X108 STRL (DRAPES) ×4
DRAPE SURG ORHT 6 SPLT 77X108 (DRAPES) ×4 IMPLANT
DRAPE U-SHAPE 47X51 STRL (DRAPES) ×8 IMPLANT
DRILL BIT 2.7X100 214235006 DU (MISCELLANEOUS) ×4 IMPLANT
DRILL BIT AO MATTA 2.5MX230M (BIT) ×4
DRILL STEP 3.5 (DRILL)
DRSG ADAPTIC 3X8 NADH LF (GAUZE/BANDAGES/DRESSINGS) ×4 IMPLANT
DRSG MEPILEX BORDER 4X12 (GAUZE/BANDAGES/DRESSINGS) ×4 IMPLANT
DRSG MEPILEX BORDER 4X4 (GAUZE/BANDAGES/DRESSINGS) ×4 IMPLANT
DRSG MEPILEX BORDER 4X8 (GAUZE/BANDAGES/DRESSINGS) ×4 IMPLANT
DRSG PAD ABDOMINAL 8X10 ST (GAUZE/BANDAGES/DRESSINGS) ×8 IMPLANT
ELECT BLADE 6.5 EXT (BLADE) ×4 IMPLANT
ELECT REM PT RETURN 9FT ADLT (ELECTROSURGICAL) ×4
ELECTRODE REM PT RTRN 9FT ADLT (ELECTROSURGICAL) ×2 IMPLANT
GAUZE SPONGE 4X4 12PLY STRL (GAUZE/BANDAGES/DRESSINGS) ×4 IMPLANT
GLOVE BIO SURGEON STRL SZ7.5 (GLOVE) ×8 IMPLANT
GLOVE BIO SURGEON STRL SZ8 (GLOVE) ×8 IMPLANT
GLOVE BIOGEL PI IND STRL 7.5 (GLOVE) ×4 IMPLANT
GLOVE BIOGEL PI IND STRL 8 (GLOVE) ×4 IMPLANT
GLOVE BIOGEL PI INDICATOR 7.5 (GLOVE) ×4
GLOVE BIOGEL PI INDICATOR 8 (GLOVE) ×4
GOWN STRL REUS W/ TWL LRG LVL3 (GOWN DISPOSABLE) ×10 IMPLANT
GOWN STRL REUS W/ TWL XL LVL3 (GOWN DISPOSABLE) ×6 IMPLANT
GOWN STRL REUS W/TWL LRG LVL3 (GOWN DISPOSABLE) ×10
GOWN STRL REUS W/TWL XL LVL3 (GOWN DISPOSABLE) ×6
HANDPIECE INTERPULSE COAX TIP (DISPOSABLE) ×2
IMMOBILIZER KNEE 22 UNIV (SOFTGOODS) ×4 IMPLANT
K-WIRE .62 (WIRE) ×8 IMPLANT
K-WIRE ACE 1.6X6 (WIRE) ×8
KIT BASIN OR (CUSTOM PROCEDURE TRAY) ×4 IMPLANT
KIT JUGGERKNOT DISP 2.9MM (KITS) ×4 IMPLANT
KIT TURNOVER KIT B (KITS) ×4 IMPLANT
KWIRE ACE 1.6X6 (WIRE) ×4 IMPLANT
MANIFOLD NEPTUNE II (INSTRUMENTS) ×8 IMPLANT
NDL SUT .5 MAYO 1.404X.05X (NEEDLE) ×2 IMPLANT
NDL SUT 6 .5 CRC .975X.05 MAYO (NEEDLE) IMPLANT
NEEDLE MAYO TAPER (NEEDLE) ×2
NS IRRIG 1000ML POUR BTL (IV SOLUTION) ×4 IMPLANT
PACK ORTHO EXTREMITY (CUSTOM PROCEDURE TRAY) ×4 IMPLANT
PACK TOTAL JOINT (CUSTOM PROCEDURE TRAY) ×4 IMPLANT
PAD ARMBOARD 7.5X6 YLW CONV (MISCELLANEOUS) ×8 IMPLANT
PAD CAST 4YDX4 CTTN HI CHSV (CAST SUPPLIES) ×2 IMPLANT
PADDING CAST COTTON 4X4 STRL (CAST SUPPLIES) ×2
PADDING CAST COTTON 6X4 STRL (CAST SUPPLIES) ×4 IMPLANT
PENCIL BUTTON HOLSTER BLD 10FT (ELECTRODE) ×4 IMPLANT
PILLOW ABDUCTION HIP (SOFTGOODS) ×4 IMPLANT
PIN APEX 6X180MM EXFIX (EXFIX) ×4 IMPLANT
PLATE ACET STRT 70.5M 6H (Plate) ×4 IMPLANT
PLATE ACET STRT 94.5M 8H (Plate) ×4 IMPLANT
PLATE LOCK 5H STD RT PROX TIB (Plate) ×4 IMPLANT
RETRACTOR YANK SUCT EIGR SABER (INSTRUMENTS) ×4 IMPLANT
RETRIEVER SUT HEWSON (MISCELLANEOUS) ×4 IMPLANT
SCREW CORT 48X3.5XST NS LF (Screw) ×2 IMPLANT
SCREW CORTEX ST MATTA 3.5X18MM (Screw) ×4 IMPLANT
SCREW CORTEX ST MATTA 3.5X20 (Screw) ×12 IMPLANT
SCREW CORTEX ST MATTA 3.5X24 (Screw) ×4 IMPLANT
SCREW CORTEX ST MATTA 3.5X26MM (Screw) ×4 IMPLANT
SCREW CORTEX ST MATTA 3.5X30MM (Screw) ×8 IMPLANT
SCREW CORTEX ST MATTA 3.5X32MM (Screw) ×4 IMPLANT
SCREW CORTICAL 3.5MM  44MM (Screw) ×2 IMPLANT
SCREW CORTICAL 3.5MM 40MM (Screw) ×4 IMPLANT
SCREW CORTICAL 3.5MM 44MM (Screw) ×2 IMPLANT
SCREW CORTICAL 3.5MM 48MM (Screw) ×4 IMPLANT
SCREW CORTICAL 3.5X48MM (Screw) ×2 IMPLANT
SCREW LOCK CORT STAR 3.5X70 (Screw) ×4 IMPLANT
SCREW LOCK CORT STAR 3.5X80 (Screw) ×12 IMPLANT
SCREW LOCK CORT STAR 3.5X85 (Screw) ×4 IMPLANT
SCREW LOW PROF CORTICAL 3.5X80 (Screw) ×4 IMPLANT
SCREW LP 3.5X75MM (Screw) ×4 IMPLANT
SET HNDPC FAN SPRY TIP SCT (DISPOSABLE) ×2 IMPLANT
SPONGE LAP 18X18 X RAY DECT (DISPOSABLE) ×12 IMPLANT
STAPLER VISISTAT 35W (STAPLE) ×4 IMPLANT
STOCKINETTE IMPERVIOUS LG (DRAPES) ×4 IMPLANT
STRIP CLOSURE SKIN 1/2X4 (GAUZE/BANDAGES/DRESSINGS) ×3 IMPLANT
SUCTION FRAZIER HANDLE 10FR (MISCELLANEOUS) ×2
SUCTION TUBE FRAZIER 10FR DISP (MISCELLANEOUS) ×2 IMPLANT
SUT ETHILON 2 0 PSLX (SUTURE) ×8 IMPLANT
SUT ETHILON 3 0 PS 1 (SUTURE) IMPLANT
SUT FIBERWIRE #2 38 T-5 BLUE (SUTURE) ×8
SUT PROLENE 0 CT 2 (SUTURE) ×8 IMPLANT
SUT VIC AB 0 CT1 27 (SUTURE) ×4
SUT VIC AB 0 CT1 27XBRD ANBCTR (SUTURE) ×4 IMPLANT
SUT VIC AB 1 CT1 18XCR BRD 8 (SUTURE) ×2 IMPLANT
SUT VIC AB 1 CT1 27 (SUTURE) ×2
SUT VIC AB 1 CT1 27XBRD ANBCTR (SUTURE) ×2 IMPLANT
SUT VIC AB 1 CT1 8-18 (SUTURE) ×2
SUT VIC AB 2-0 CT1 27 (SUTURE) ×4
SUT VIC AB 2-0 CT1 TAPERPNT 27 (SUTURE) ×4 IMPLANT
SUTURE FIBERWR #2 38 T-5 BLUE (SUTURE) ×4 IMPLANT
TOWEL OR 17X24 6PK STRL BLUE (TOWEL DISPOSABLE) ×8 IMPLANT
TOWEL OR 17X26 10 PK STRL BLUE (TOWEL DISPOSABLE) ×8 IMPLANT
TRAY FOLEY MTR SLVR 16FR STAT (SET/KITS/TRAYS/PACK) IMPLANT
TUBE CONNECTING 12'X1/4 (SUCTIONS) ×1
TUBE CONNECTING 12X1/4 (SUCTIONS) ×3 IMPLANT
WATER STERILE IRR 1000ML POUR (IV SOLUTION) ×8 IMPLANT
YANKAUER SUCT BULB TIP NO VENT (SUCTIONS) ×8 IMPLANT

## 2017-12-21 NOTE — Plan of Care (Signed)
  Problem: Education: Goal: Knowledge of General Education information will improve Outcome: Progressing   Problem: Health Behavior/Discharge Planning: Goal: Ability to manage health-related needs will improve Outcome: Progressing   Problem: Clinical Measurements: Goal: Ability to maintain clinical measurements within normal limits will improve Outcome: Progressing Goal: Will remain free from infection Outcome: Progressing Goal: Diagnostic test results will improve Outcome: Progressing Goal: Respiratory complications will improve Outcome: Progressing Goal: Cardiovascular complication will be avoided Outcome: Progressing   Problem: Activity: Goal: Risk for activity intolerance will decrease Outcome: Progressing   Problem: Nutrition: Goal: Adequate nutrition will be maintained Outcome: Progressing   Problem: Coping: Goal: Level of anxiety will decrease Outcome: Progressing   Problem: Elimination: Goal: Will not experience complications related to bowel motility Outcome: Progressing Goal: Will not experience complications related to urinary retention Outcome: Progressing   Problem: Pain Managment: Goal: General experience of comfort will improve Outcome: Progressing   Problem: Safety: Goal: Ability to remain free from injury will improve Outcome: Progressing   Problem: Skin Integrity: Goal: Risk for impaired skin integrity will decrease Outcome: Progressing   Patient has a 1330 surgery today
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
## 2017-12-21 NOTE — Transfer of Care (Signed)
Immediate Anesthesia Transfer of Care Note  Patient: Daniel Franco  Procedure(s) Performed: OPEN REDUCTION INTERNAL FIXATION (ORIF) ACETABULAR FRACTURE (Right Hip) OPEN REDUCTION INTERNAL FIXATION (ORIF) TIBIAL PLATEAU (Right Knee)  Patient Location: PACU  Anesthesia Type:General  Level of Consciousness: awake, alert  and oriented  Airway & Oxygen Therapy: Patient Spontanous Breathing and Patient connected to face mask oxygen  Post-op Assessment: Report given to RN and Post -op Vital signs reviewed and stable  Post vital signs: Reviewed and stable  Last Vitals:  Vitals Value Taken Time  BP 145/72 12/21/2017  6:44 PM  Temp 36.3 C 12/21/2017  6:44 PM  Pulse 73 12/21/2017  6:44 PM  Resp 16 12/21/2017  6:44 PM  SpO2 98 % 12/21/2017  6:44 PM  Vitals shown include unvalidated device data.  Last Pain:  Vitals:   12/21/17 1844  TempSrc:   PainSc: (P) Asleep      Patients Stated Pain Goal: 2 (12/20/17 1040)  Complications: No apparent anesthesia complications

## 2017-12-21 NOTE — Brief Op Note (Signed)
12/19/2017 - 12/21/2017  6:21 PM  PATIENT:  Daniel Franco  42 y.o. male  PRE-OPERATIVE DIAGNOSIS:   1. RIGHT BICONDYLAR TIBIAL PLATEAU FRACTURE 2. RIGHT TRANSVERSE POSTERIOR WALL ACETABULAR FRACTURE WITH INCARCERATED FRAGMENT 3. RIGHT KNEE JOINT HEMARTHROSIS  POST-OPERATIVE DIAGNOSIS:   1. RIGHT BICONDYLAR TIBIAL PLATEAU FRACTURE 2. RIGHT TRANSVERSE POSTERIOR WALL ACETABULAR FRACTURE WITH INCARCERATED FRAGMENT 3. RIGHT KNEE JOINT HEMARTHROSIS  PROCEDURE:  Procedure(s): 1. OPEN REDUCTION INTERNAL FIXATION (ORIF) ACETABULAR FRACTURE (Right) 2. OPEN REDUCTION INTERNAL FIXATION (ORIF) BICONDYLAR TIBIAL PLATEAU (Right) 3. ANTERIOR COMPARTMENT FASCIOTOMY 4. ASPIRATION OF RIGHT KNEE JOINT HEMARTHROSIS  SURGEON:  Surgeon(s) and Role:    Myrene Galas, MD - Primary  PHYSICIAN ASSISTANT: Montez Morita, PA-C  ANESTHESIA:   general  EBL:  200 mL   BLOOD ADMINISTERED:none  DRAINS: none   LOCAL MEDICATIONS USED:  NONE  SPECIMEN:  No Specimen  DISPOSITION OF SPECIMEN:  N/A  COUNTS:  YES  TOURNIQUET:  * No tourniquets in log *  DICTATION: .Other Dictation: Dictation Number written  PLAN OF CARE: Admit to inpatient   PATIENT DISPOSITION:  PACU - hemodynamically stable.   Delay start of Pharmacological VTE agent (>24hrs) due to surgical blood loss or risk of bleeding: no

## 2017-12-21 NOTE — Anesthesia Preprocedure Evaluation (Signed)
Anesthesia Evaluation  Patient identified by MRN, date of birth, ID band Patient awake    Reviewed: Allergy & Precautions, NPO status , Patient's Chart, lab work & pertinent test results  Airway Mallampati: III  TM Distance: >3 FB Neck ROM: Full    Dental no notable dental hx. (+) Teeth Intact, Dental Advisory Given   Pulmonary Current Smoker,    Pulmonary exam normal breath sounds clear to auscultation       Cardiovascular Exercise Tolerance: Good negative cardio ROS Normal cardiovascular exam Rhythm:Regular Rate:Normal     Neuro/Psych negative neurological ROS     GI/Hepatic negative GI ROS, Neg liver ROS,   Endo/Other  negative endocrine ROS  Renal/GU negative Renal ROS     Musculoskeletal   Abdominal (+) - obese,   Peds  Hematology   Anesthesia Other Findings   Reproductive/Obstetrics                             Lab Results  Component Value Date   WBC 9.9 12/21/2017   HGB 12.8 (L) 12/21/2017   HCT 37.9 (L) 12/21/2017   MCV 94.5 12/21/2017   PLT 189 12/21/2017    Anesthesia Physical Anesthesia Plan  ASA: II  Anesthesia Plan: General   Post-op Pain Management:    Induction: Intravenous  PONV Risk Score and Plan: Treatment may vary due to age or medical condition  Airway Management Planned: Oral ETT  Additional Equipment:   Intra-op Plan:   Post-operative Plan: Extubation in OR  Informed Consent: I have reviewed the patients History and Physical, chart, labs and discussed the procedure including the risks, benefits and alternatives for the proposed anesthesia with the patient or authorized representative who has indicated his/her understanding and acceptance.   Dental advisory given  Plan Discussed with:   Anesthesia Plan Comments:         Anesthesia Quick Evaluation
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
## 2017-12-21 NOTE — Anesthesia Procedure Notes (Addendum)
Procedure Name: Intubation Date/Time: 12/21/2017 1:36 PM Performed by: Izola Price., CRNA Pre-anesthesia Checklist: Patient identified, Emergency Drugs available, Suction available and Patient being monitored Patient Re-evaluated:Patient Re-evaluated prior to induction Oxygen Delivery Method: Circle System Utilized Preoxygenation: Pre-oxygenation with 100% oxygen Induction Type: IV induction and Cricoid Pressure applied Ventilation: Mask ventilation without difficulty and Oral airway inserted - appropriate to patient size Laryngoscope Size: Hyacinth Meeker and 2 Grade View: Grade I Tube type: Oral Tube size: 7.5 mm Number of attempts: 1 Airway Equipment and Method: Stylet and Oral airway Placement Confirmation: ETT inserted through vocal cords under direct vision,  positive ETCO2 and breath sounds checked- equal and bilateral Secured at: 23 cm Tube secured with: Tape Dental Injury: Teeth and Oropharynx as per pre-operative assessment

## 2017-12-22 ENCOUNTER — Encounter (HOSPITAL_COMMUNITY): Payer: Self-pay | Admitting: Orthopedic Surgery

## 2017-12-22 ENCOUNTER — Ambulatory Visit
Admit: 2017-12-22 | Discharge: 2017-12-22 | Disposition: A | Discharge status: Home / Self Care | Payer: 59 | Attending: Radiation Oncology | Admitting: Radiation Oncology

## 2017-12-22 ENCOUNTER — Telehealth: Payer: Self-pay | Admitting: *Deleted

## 2017-12-22 DIAGNOSIS — F10929 Alcohol use, unspecified with intoxication, unspecified: Secondary | ICD-10-CM | POA: Diagnosis present

## 2017-12-22 DIAGNOSIS — S73004A Unspecified dislocation of right hip, initial encounter: Secondary | ICD-10-CM | POA: Diagnosis present

## 2017-12-22 HISTORY — DX: Unspecified dislocation of right hip, initial encounter: S73.004A

## 2017-12-22 LAB — BASIC METABOLIC PANEL
Anion gap: 10 (ref 5–15)
BUN: 12 mg/dL (ref 6–20)
CHLORIDE: 100 mmol/L — AB (ref 101–111)
CO2: 28 mmol/L (ref 22–32)
CREATININE: 0.9 mg/dL (ref 0.61–1.24)
Calcium: 8.5 mg/dL — ABNORMAL LOW (ref 8.9–10.3)
GFR calc non Af Amer: >60 mL/min (ref >60)
Glucose, Bld: 119 mg/dL — ABNORMAL HIGH (ref 65–99)
POTASSIUM: 3.9 mmol/L (ref 3.5–5.1)
Sodium: 138 mmol/L (ref 135–145)

## 2017-12-22 LAB — CBC
HEMATOCRIT: 34.9 % — AB (ref 39.0–52.0)
Hemoglobin: 12 g/dL — ABNORMAL LOW (ref 13.0–17.0)
MCH: 32.3 pg (ref 26.0–34.0)
MCHC: 34.4 g/dL (ref 30.0–36.0)
MCV: 94.1 fL (ref 78.0–100.0)
Platelets: 224 10*3/uL (ref 150–400)
RBC: 3.71 MIL/uL — AB (ref 4.22–5.81)
RDW: 12.7 % (ref 11.5–15.5)
WBC: 13.2 10*3/uL — AB (ref 4.0–10.5)

## 2017-12-22 MED ORDER — METHOCARBAMOL 1000 MG/10ML IJ SOLN
1000.0000 mg | Freq: Four times a day (QID) | INTRAVENOUS | Status: DC
  Filled 2017-12-22 (×27): qty 10

## 2017-12-22 MED ORDER — MORPHINE SULFATE 2 MG/ML IV SOLN
INTRAVENOUS | Status: DC
  Administered 2017-12-22: 18:00:00 via INTRAVENOUS
  Administered 2017-12-22: 30 mg via INTRAVENOUS
  Administered 2017-12-22: 1.5 mg via INTRAVENOUS
  Administered 2017-12-22: 2.5 mg via INTRAVENOUS
  Administered 2017-12-22: 6.8 mg via INTRAVENOUS
  Filled 2017-12-22 (×2): qty 25

## 2017-12-22 MED ORDER — HYDROXYZINE HCL 25 MG PO TABS
50.0000 mg | ORAL_TABLET | Freq: Three times a day (TID) | ORAL | Status: DC | PRN
  Administered 2017-12-22 – 2017-12-23 (×2): 50 mg via ORAL
  Filled 2017-12-22 (×2): qty 2

## 2017-12-22 MED ORDER — NALOXONE HCL 0.4 MG/ML IJ SOLN: 0.4000 mg | INTRAMUSCULAR | Status: DC | PRN

## 2017-12-22 MED ORDER — ONDANSETRON HCL 4 MG/2ML IJ SOLN: 4.0000 mg | Freq: Four times a day (QID) | INTRAMUSCULAR | Status: DC | PRN

## 2017-12-22 MED ORDER — DIPHENHYDRAMINE HCL 12.5 MG/5ML PO ELIX: 12.5000 mg | ORAL_SOLUTION | Freq: Four times a day (QID) | ORAL | Status: DC | PRN

## 2017-12-22 MED ORDER — METHOCARBAMOL 500 MG PO TABS
1000.0000 mg | ORAL_TABLET | Freq: Four times a day (QID) | ORAL | Status: DC
Start: 2017-12-22 — End: 2017-12-28
  Administered 2017-12-22 – 2017-12-28 (×26): 1000 mg via ORAL
  Filled 2017-12-22 (×26): qty 2

## 2017-12-22 MED ORDER — SODIUM CHLORIDE 0.9% FLUSH: 9.0000 mL | INTRAVENOUS | Status: DC | PRN

## 2017-12-22 MED ORDER — KETOROLAC TROMETHAMINE 30 MG/ML IJ SOLN
30.0000 mg | Freq: Four times a day (QID) | INTRAMUSCULAR | Status: AC
  Administered 2017-12-22 – 2017-12-25 (×12): 30 mg via INTRAVENOUS
  Filled 2017-12-22 (×12): qty 1

## 2017-12-22 MED ORDER — DIPHENHYDRAMINE HCL 50 MG/ML IJ SOLN
12.5000 mg | Freq: Four times a day (QID) | INTRAMUSCULAR | Status: DC | PRN
  Administered 2017-12-26 – 2017-12-27 (×2): 12.5 mg via INTRAVENOUS
  Filled 2017-12-22 (×2): qty 1

## 2017-12-22 NOTE — Telephone Encounter (Signed)
Called the patient's fiance' Lacie to give her a update regarding the patients radiation treatment.  Unable to leave a voicemail as the mailbox is full.  Will try again as time permits.  Will continue to follow as necessary.  Sondra Come, RN

## 2017-12-22 NOTE — Anesthesia Postprocedure Evaluation (Signed)
Anesthesia Post Note  Patient: Daniel Franco  Procedure(s) Performed: OPEN REDUCTION INTERNAL FIXATION (ORIF) ACETABULAR FRACTURE (Right Hip) OPEN REDUCTION INTERNAL FIXATION (ORIF) TIBIAL PLATEAU (Right Knee)     Patient location during evaluation: PACU Anesthesia Type: General Level of consciousness: awake and alert Pain management: pain level controlled Vital Signs Assessment: post-procedure vital signs reviewed and stable Respiratory status: spontaneous breathing, nonlabored ventilation and respiratory function stable Cardiovascular status: blood pressure returned to baseline and stable Postop Assessment: no apparent nausea or vomiting Anesthetic complications: no    Last Vitals:  Vitals:   12/22/17 0018 12/22/17 0532  BP: 117/62   Pulse: 75   Resp: 17 16  Temp: 36.7 C   SpO2: 98% 97%    Last Pain:  Vitals:   12/22/17 0532  TempSrc:   PainSc: 4                  Oona Trammel,W. EDMOND
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
## 2017-12-22 NOTE — Progress Notes (Signed)
Spoke with Marissa RN at [facility] to let her know that the patient is scheduled for a radiation treatment on 12/23/2017 [facility] at 3:00 pm.  I let her know that she would need to get carelink set up to have the patient here at 2:45 pm.  I also asked that the patient have pain medication before coming over.  Will continue to follow as necessary.  Sondra Come, RN

## 2017-12-22 NOTE — Progress Notes (Signed)
Rehab Admissions Coordinator Note:  Patient was screened by Clois Dupes for appropriateness for an Inpatient Acute Rehab Consult per PT and OT recommendations. I contacted Montez Morita, PA for order. He will assess timing of when to place order. I will follow.Clois Dupes 12/22/2017, 1:10 PM  I can be reached at [phone].
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
## 2017-12-22 NOTE — Progress Notes (Signed)
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 these results. Electronically Signed   By: Roanna Raider M.D.   On: 12/19/2017 23:02       IMPRESSION:  The patient has been diagnosed with a acetabular fracture of the right hip. 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  We have discussed the rationale of this treatment with the patient. I have discussed the possible/expected benefit of such a treatment. I have also discussed the possible side effects and risks of treatment as well. All of the patient's questions have been answered.   PLAN: The patient will undergo simulation and one fraction of external beam radiation treatment. This will be completed to a dose of 7 Gy. This treatment will be completed on postoperative day #2.   The above documentation reflects my direct findings during this shared patient visit. Please see the separate note by Dr. Mitzi Hansen on this date for the remainder of the patient's plan of care.     Osker Mason, PAC
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
## 2017-12-23 ENCOUNTER — Encounter (HOSPITAL_COMMUNITY): Payer: Self-pay | Admitting: Orthopedic Surgery

## 2017-12-23 ENCOUNTER — Ambulatory Visit
Admit: 2017-12-23 | Discharge: 2017-12-23 | Disposition: A | Discharge status: Home / Self Care | Payer: 59 | Attending: Radiation Oncology | Admitting: Radiation Oncology

## 2017-12-23 ENCOUNTER — Encounter: Payer: Self-pay | Admitting: Radiation Oncology

## 2017-12-23 LAB — BASIC METABOLIC PANEL
Anion gap: 7 (ref 5–15)
BUN: 17 mg/dL (ref 6–20)
CO2: 29 mmol/L (ref 22–32)
Calcium: 8.3 mg/dL — ABNORMAL LOW (ref 8.9–10.3)
Chloride: 105 mmol/L (ref 101–111)
Creatinine, Ser: 1.03 mg/dL (ref 0.61–1.24)
GFR calc Af Amer: >60 mL/min (ref >60)
GLUCOSE: 104 mg/dL — AB (ref 65–99)
POTASSIUM: 4.2 mmol/L (ref 3.5–5.1)
Sodium: 141 mmol/L (ref 135–145)

## 2017-12-23 LAB — CBC
HEMATOCRIT: 30.1 % — AB (ref 39.0–52.0)
Hemoglobin: 10 g/dL — ABNORMAL LOW (ref 13.0–17.0)
MCH: 32.2 pg (ref 26.0–34.0)
MCHC: 33.2 g/dL (ref 30.0–36.0)
MCV: 96.8 fL (ref 78.0–100.0)
Platelets: 193 10*3/uL (ref 150–400)
RBC: 3.11 MIL/uL — ABNORMAL LOW (ref 4.22–5.81)
RDW: 13 % (ref 11.5–15.5)
WBC: 8.6 10*3/uL (ref 4.0–10.5)

## 2017-12-23 MED ORDER — HYDROMORPHONE 1 MG/ML IV SOLN
INTRAVENOUS | Status: DC
  Administered 2017-12-23: 3.6 mg via INTRAVENOUS
  Administered 2017-12-23: via INTRAVENOUS
  Administered 2017-12-23: 2.8 mg via INTRAVENOUS
  Administered 2017-12-23: 1.8 mg via INTRAVENOUS
  Administered 2017-12-23: 3.3 mg via INTRAVENOUS
  Administered 2017-12-24: 03:00:00 via INTRAVENOUS
  Filled 2017-12-23 (×2): qty 25
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
## 2017-12-23 NOTE — Progress Notes (Addendum)
1440 Pt was transported by [facility] to have radiation therapy at Cancer center.   1650 Received pt back from Radiation therapy via [facility]. Using PCA for pain control.

## 2017-12-23 NOTE — Progress Notes (Signed)
[facility] Radiation Oncology Dept Therapy Treatment Record Phone [phone]   Radiation Therapy was administered to Daniel Franco on: 12/23/2017  3:34 PM and was treatment # 1out of a planned course of 1 treatments.  Radiation Treatment  1). Beam photons with 6-10 energy  2). Brachytherapy None  3). Stereotactic Radiosurgery None  4). Other Radiation None     Bryna Razavi, RT (T)

## 2017-12-24 DIAGNOSIS — Z7409 Other reduced mobility: Secondary | ICD-10-CM

## 2017-12-24 DIAGNOSIS — S32461D Displaced associated transverse-posterior fracture of right acetabulum, subsequent encounter for fracture with routine healing: Secondary | ICD-10-CM

## 2017-12-24 LAB — BASIC METABOLIC PANEL
ANION GAP: 8 (ref 5–15)
BUN: 17 mg/dL (ref 6–20)
CHLORIDE: 103 mmol/L (ref 101–111)
CO2: 25 mmol/L (ref 22–32)
CREATININE: 0.89 mg/dL (ref 0.61–1.24)
Calcium: 8.3 mg/dL — ABNORMAL LOW (ref 8.9–10.3)
GFR calc non Af Amer: >60 mL/min (ref >60)
Glucose, Bld: 122 mg/dL — ABNORMAL HIGH (ref 65–99)
Potassium: 3.8 mmol/L (ref 3.5–5.1)
Sodium: 136 mmol/L (ref 135–145)

## 2017-12-24 MED ORDER — NALOXONE HCL 0.4 MG/ML IJ SOLN: 0.4000 mg | INTRAMUSCULAR | Status: DC | PRN

## 2017-12-24 MED ORDER — MORPHINE SULFATE (PF) 2 MG/ML IV SOLN
1.0000 mg | INTRAVENOUS | Status: DC | PRN
  Administered 2017-12-24 – 2017-12-28 (×20): 1 mg via INTRAVENOUS
  Filled 2017-12-24 (×20): qty 1

## 2017-12-24 MED ORDER — HYDROCODONE-ACETAMINOPHEN 10-325 MG PO TABS
1.0000 | ORAL_TABLET | Freq: Four times a day (QID) | ORAL | Status: DC | PRN
  Administered 2017-12-25 – 2017-12-28 (×11): 1 via ORAL
  Filled 2017-12-24 (×11): qty 1

## 2017-12-24 MED ORDER — MORPHINE SULFATE ER 30 MG PO TBCR
30.0000 mg | EXTENDED_RELEASE_TABLET | Freq: Two times a day (BID) | ORAL | Status: DC
  Administered 2017-12-24 – 2017-12-28 (×9): 30 mg via ORAL
  Filled 2017-12-24 (×9): qty 1
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
## 2017-12-24 NOTE — Progress Notes (Signed)
Rehab admissions - I am following for potential acute inpatient rehab admission.  Please see Dr. Riley Kill consult.  I will follow up on Monday.  Call me for questions.  #[phone]
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
## 2017-12-24 NOTE — Plan of Care (Signed)
  Problem: Education: Goal: Knowledge of General Education information will improve Outcome: Progressing   Problem: Clinical Measurements: Goal: Ability to maintain clinical measurements within normal limits will improve Outcome: Progressing   Problem: Clinical Measurements: Goal: Will remain free from infection Outcome: Progressing   Problem: Activity: Goal: Risk for activity intolerance will decrease Outcome: Progressing   Problem: Pain Managment: Goal: General experience of comfort will improve Outcome: Progressing   
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
## 2017-12-26 MED ORDER — CELECOXIB 200 MG PO CAPS
200.0000 mg | ORAL_CAPSULE | Freq: Every day | ORAL | Status: DC
  Administered 2017-12-26 – 2017-12-27 (×2): 200 mg via ORAL
  Filled 2017-12-26 (×2): qty 1

## 2017-12-26 NOTE — Plan of Care (Signed)
°  Problem: Activity: °Goal: Risk for activity intolerance will decrease °Outcome: Progressing °  °Problem: Elimination: °Goal: Will not experience complications related to bowel motility °Outcome: Progressing °  °Problem: Pain Managment: °Goal: General experience of comfort will improve °Outcome: Progressing °  °Problem: Skin Integrity: °Goal: Risk for impaired skin integrity will decrease °Outcome: Progressing °  °
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
## 2017-12-27 ENCOUNTER — Inpatient Hospital Stay (HOSPITAL_COMMUNITY): Payer: 59

## 2017-12-27 DIAGNOSIS — M79609 Pain in unspecified limb: Secondary | ICD-10-CM

## 2017-12-27 LAB — CREATININE, SERUM: CREATININE: 1.01 mg/dL (ref 0.61–1.24)

## 2017-12-27 MED ORDER — RIVAROXABAN 15 MG PO TABS
15.0000 mg | ORAL_TABLET | Freq: Two times a day (BID) | ORAL | Status: DC
  Filled 2017-12-27 (×4): qty 1

## 2017-12-27 NOTE — Progress Notes (Signed)
Rehab admissions - I did open the case with insurance carrier and request acute inpatient rehab admission.  However, please see PT notes from today.  Patient  Is at supervision level and likely [facility] once [facility] is medically stable.  Patient tells me that he now has a blood clot.  Call me for questions.  #[phone]
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
## 2017-12-27 NOTE — Progress Notes (Signed)
  Radiation Oncology         [phone] ________________________________  Name: Daniel Franco MRN: [id number]  Date: 12/23/2017  DOB: 02/10/1976  End of Treatment Note  Diagnosis:   Right acetabular fracture with risk of postoperative heterotopic ossification     Indication for treatment:  curative       Radiation treatment dates:   12/23/2017  Site/dose:   The patient was treated to a dose of 7 Gy to the right hip. This was accomplished with AP and PA fields.  Narrative: The patient tolerated radiation treatment relatively well.   No difficulties.  Plan: The patient has completed radiation treatment. The patient will return to radiation oncology clinic on an as needed basis. ________________________________  Radene GunningJohn S. Ferrah Panagopoulos, MD, PhD  This document serves as a record of services personally performed by Dorothy PufferJohn Kenderick Kobler, MD. It was created on his behalf by [name], a trained medical scribe. The creation of this record is based on the scribe's personal observations and the provider's statements to them. This document has been checked and approved by the attending provider.

## 2017-12-27 NOTE — Progress Notes (Signed)
VASCULAR LAB PRELIMINARY  PRELIMINARY  PRELIMINARY  PRELIMINARY  Bilateral lower extremity venous duplex completed.    Preliminary report:  There is acute DVT noted in the right peroneal vein.  All other veins appear patent.   Kaiven Vester, RVT 12/27/2017, 12:32 PM
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
## 2017-12-28 ENCOUNTER — Encounter (HOSPITAL_COMMUNITY): Payer: Self-pay | Admitting: Orthopedic Surgery

## 2017-12-28 DIAGNOSIS — I82451 Acute embolism and thrombosis of right peroneal vein: Secondary | ICD-10-CM

## 2017-12-28 DIAGNOSIS — F17200 Nicotine dependence, unspecified, uncomplicated: Secondary | ICD-10-CM | POA: Diagnosis present

## 2017-12-28 HISTORY — DX: Nicotine dependence, unspecified, uncomplicated: F17.200

## 2017-12-28 HISTORY — DX: Acute embolism and thrombosis of right peroneal vein: I82.451

## 2017-12-28 MED ORDER — RIVAROXABAN 20 MG PO TABS: 20.0000 mg | ORAL_TABLET | Freq: Every day | ORAL | Status: DC

## 2017-12-28 MED ORDER — MORPHINE SULFATE ER 30 MG PO TBCR: 30.0000 mg | EXTENDED_RELEASE_TABLET | Freq: Two times a day (BID) | ORAL | 0 refills | Status: AC

## 2017-12-28 MED ORDER — RIVAROXABAN 15 MG PO TABS
15.0000 mg | ORAL_TABLET | Freq: Two times a day (BID) | ORAL | Status: DC
  Administered 2017-12-28 (×2): 15 mg via ORAL
  Filled 2017-12-28 (×2): qty 1

## 2017-12-28 MED ORDER — POLYETHYLENE GLYCOL 3350 17 G PO PACK: 17.0000 g | PACK | Freq: Every day | ORAL | 0 refills | Status: AC

## 2017-12-28 MED ORDER — METHOCARBAMOL 500 MG PO TABS: 500.0000 mg | ORAL_TABLET | Freq: Four times a day (QID) | ORAL | 0 refills | Status: AC | PRN

## 2017-12-28 MED ORDER — RIVAROXABAN (XARELTO) EDUCATION KIT FOR DVT/PE PATIENTS: 1.0000 | PACK | Freq: Once | 0 refills | Status: AC

## 2017-12-28 MED ORDER — RIVAROXABAN (XARELTO) EDUCATION KIT FOR DVT/PE PATIENTS: 1.0000 | PACK | Freq: Once | 0 refills | Status: DC

## 2017-12-28 MED ORDER — HYDROCODONE-ACETAMINOPHEN 10-325 MG PO TABS: 1.0000 | ORAL_TABLET | Freq: Four times a day (QID) | ORAL | 0 refills | Status: AC | PRN

## 2017-12-28 MED ORDER — RIVAROXABAN (XARELTO) VTE STARTER PACK (15 & 20 MG): ORAL_TABLET | ORAL | 0 refills | Status: AC

## 2017-12-28 MED ORDER — RIVAROXABAN 20 MG PO TABS: 20.0000 mg | ORAL_TABLET | Freq: Every day | ORAL | 1 refills | Status: AC

## 2017-12-28 MED ORDER — DOCUSATE SODIUM 100 MG PO CAPS: 100.0000 mg | ORAL_CAPSULE | Freq: Two times a day (BID) | ORAL | 0 refills | Status: AC
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
## 2017-12-28 NOTE — Progress Notes (Signed)
    Durable Medical Equipment  (From admission, onward)        Start     Ordered   12/27/17 1434  For home use only DME Walker rolling  Once    Question:  Patient needs a walker to treat with the following condition  Answer:  Multiple fractures   12/27/17 1433   12/27/17 1432  For home use only DME 3 n 1  Once     12/27/17 1432   12/27/17 1432  For home use only DME lightweight manual wheelchair with seat cushion  Once    Comments:  Patient suffers from right hip fracture, right knee fracture, which impairs their ability to perform daily activities like ambulating in the home.  A cane will not resolve  issue with performing activities of daily living. A wheelchair will allow patient to safely perform daily activities. Patient is not able to propel themselves in the home using a standard weight wheelchair due to generalized weakness. Patient can self propel in the lightweight wheelchair.  Accessories: elevating leg rests (ELRs), wheel locks, extensions and anti-tippers.   12/27/17 1432     Joycelyn RuaAimee Eleanora Guinyard, PTA pager [phone]
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
## 2017-12-28 NOTE — Progress Notes (Signed)
Pt given paper prescriptions, discharge instructions, follow up appointment information and medication administration education given to pt. Pt verbalized understanding including when to call PCP or return to the emergency department.
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
## 2017-12-28 NOTE — Discharge Instructions (Addendum)
[facility] Discharge Instructions   General Discharge Instructions  WEIGHT BEARING STATUS: Nonweightbearing Right Leg   RANGE OF MOTION/ACTIVITY:  Posterior hip precautions R hip  Unrestricted ROM R knee                           PT- Home exercise program for R knee ROM- AROM, PROM. Prone exercises as well. No ROM restrictions.  Quad sets, SLR, LAQ, SAQ, heel slides, stretching,                Ankle theraband program, heel cord stretching, toe towel curls, etc               No pillows under bend of knee when at rest, ok to place under heel to help work on extension.   Wound Care: daily wound care starting when you get home. Ok to clean wounds with soap and water and leave open to air  Discharge Wound Care Instructions  Do NOT apply any ointments, solutions or lotions to pin sites or surgical wounds.  These prevent needed drainage and even though solutions like hydrogen peroxide kill bacteria, they also damage cells lining the pin sites that help fight infection.  Applying lotions or ointments can keep the wounds moist and can cause them to breakdown and open up as well. This can increase the risk for infection. When in doubt call the office.  Surgical incisions should be dressed daily.  If any drainage is noted, use one layer of adaptic, then gauze, Kerlix, and an ace wrap.  Once the incision is completely dry and without drainage, it may be left open to air out.  Showering may begin 36-48 hours later.  Cleaning gently with soap and water.  Traumatic wounds should be dressed daily as well.    One layer of adaptic, gauze, Kerlix, then ace wrap.  The adaptic can be discontinued once the draining has ceased    If you have a wet to dry dressing: wet the gauze with saline the squeeze as much saline out so the gauze is moist (not soaking wet), place moistened gauze over wound, then place a dry gauze over the moist one, followed by Kerlix wrap, then ace wrap.    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 You will be on Xarelto for the next 3 months to treat it .   Diet: as you were eating previously.  Can use over the counter stool softeners and bowel preparations, such as Miralax, to help with bowel movements.  Narcotics can be constipating.  Be sure to drink plenty of fluids  PAIN MEDICATION USE AND EXPECTATIONS  You have likely been given narcotic medications to help control your pain.  After a traumatic event that results in an fracture (broken bone) with or without surgery, it is ok to use narcotic pain medications to help control one's pain.  We understand that everyone responds to pain differently and each individual patient will be evaluated on a regular basis for the continued need for narcotic medications. Ideally, narcotic medication use should last no more than 6-8 weeks (coinciding with fracture healing).   As a patient it is your responsibility as well to monitor narcotic medication use and report the amount and frequency you use these medications when you come to your office visit.   We would also advise that if you are using narcotic medications, you should take a dose prior  to therapy to maximize you participation.  IF YOU ARE ON NARCOTIC MEDICATIONS IT IS NOT PERMISSIBLE TO OPERATE A MOTOR VEHICLE (MOTORCYCLE/CAR/TRUCK/MOPED) OR HEAVY MACHINERY DO NOT MIX NARCOTICS WITH OTHER CNS (CENTRAL NERVOUS SYSTEM) DEPRESSANTS SUCH AS ALCOHOL   STOP SMOKING OR USING NICOTINE PRODUCTS!!!!  As discussed nicotine severely impairs your body's ability to heal surgical and traumatic wounds but also impairs bone healing.  Wounds and bone heal by forming microscopic blood vessels (angiogenesis) and nicotine is a vasoconstrictor (essentially, shrinks blood vessels).  Therefore, if vasoconstriction occurs to these microscopic blood vessels they essentially disappear and are unable to deliver necessary nutrients to the healing tissue.  This is  one modifiable factor that you can do to dramatically increase your chances of healing your injury.    (This means no smoking, no nicotine gum, patches, etc)  DO NOT USE NONSTEROIDAL ANTI-INFLAMMATORY DRUGS (NSAID'S)  Using products such as Advil (ibuprofen), Aleve (naproxen), Motrin (ibuprofen) for additional pain control during fracture healing can delay and/or prevent the healing response.  If you would like to take over the counter (OTC) medication, Tylenol (acetaminophen) is ok.  However, some narcotic medications that are given for pain control contain acetaminophen as well. Therefore, you should not exceed more than 4000 mg of tylenol in a day if you do not have liver disease.  Also note that there are may OTC medicines, such as cold medicines and allergy medicines that my contain tylenol as well.  If you have any questions about medications and/or interactions please ask your doctor/PA or your pharmacist.      ICE AND ELEVATE INJURED/OPERATIVE EXTREMITY  Using ice and elevating the injured extremity above your heart can help with swelling and pain control.  Icing in a pulsatile fashion, such as 20 minutes on and 20 minutes off, can be followed.    Do not place ice directly on skin. Make sure there is a barrier between to skin and the ice pack.    Using frozen items such as frozen peas works well as the conform nicely to the are that needs to be iced.  USE AN ACE WRAP OR TED HOSE FOR SWELLING CONTROL  In addition to icing and elevation, Ace wraps or TED hose are used to help limit and resolve swelling.  It is recommended to use Ace wraps or TED hose until you are informed to stop.    When using Ace Wraps start the wrapping distally (farthest away from the body) and wrap proximally (closer to the body)   Example: If you had surgery on your leg or thing and you do not have a splint on, start the ace wrap at the toes and work your way up to the thigh        If you had surgery on your upper  extremity and do not have a splint on, start the ace wrap at your fingers and work your way up to the upper arm  IF YOU ARE IN A SPLINT OR CAST DO NOT REMOVE IT FOR ANY REASON   If your splint gets wet for any reason please contact the office immediately. You may shower in your splint or cast as long as you keep it dry.  This can be done by wrapping in a cast cover or garbage back (or similar)  Do Not stick any thing down your splint or cast such as pencils, money, or hangers to try and scratch yourself with.  If you feel itchy take benadryl as prescribed  on the bottle for itching  IF YOU ARE IN A CAM BOOT (BLACK BOOT)  You may remove boot periodically. Perform daily dressing changes as noted below.  Wash the liner of the boot regularly and wear a sock when wearing the boot. It is recommended that you sleep in the boot until told otherwise  CALL THE OFFICE WITH ANY QUESTIONS OR CONCERNS: [phone]     Information on my medicine - XARELTO (rivaroxaban)  This medication education was reviewed with me or my healthcare representative as part of my discharge preparation.    WHY WAS XARELTO PRESCRIBED FOR YOU? Xarelto was prescribed to treat blood clots that may have been found in the veins of your legs (deep vein thrombosis) or in your lungs (pulmonary embolism) and to reduce the risk of them occurring again.  What do you need to know about Xarelto? The starting dose is one 15 mg tablet taken TWICE daily with food for the FIRST 21 DAYS then on 01/18/18  the dose is changed to one 20 mg tablet taken ONCE A DAY with your evening meal.  DO NOT stop taking Xarelto without talking to the health care provider who prescribed the medication.  Refill your prescription for 20 mg tablets before you run out.  After discharge, you should have regular check-up appointments with your healthcare provider that is prescribing your Xarelto.  In the future your dose may need to be changed if your kidney  function changes by a significant amount.  What do you do if you miss a dose? If you are taking Xarelto TWICE DAILY and you miss a dose, take it as soon as you remember. You may take two 15 mg tablets (total 30 mg) at the same time then resume your regularly scheduled 15 mg twice daily the next day.  If you are taking Xarelto ONCE DAILY and you miss a dose, take it as soon as you remember on the same day then continue your regularly scheduled once daily regimen the next day. Do not take two doses of Xarelto at the same time.   Important Safety Information Xarelto is a blood thinner medicine that can cause bleeding. You should call your healthcare provider right away if you experience any of the following: ? Bleeding from an injury or your nose that does not stop. ? Unusual colored urine (red or dark brown) or unusual colored stools (red or black). ? Unusual bruising for unknown reasons. ? A serious fall or if you hit your head (even if there is no bleeding).  Some medicines may interact with Xarelto and might increase your risk of bleeding while on Xarelto. To help avoid this, consult your healthcare provider or pharmacist prior to using any new prescription or non-prescription medications, including herbals, vitamins, non-steroidal anti-inflammatory drugs (NSAIDs) and supplements.  This website has more information on Xarelto: [email or web address].com.br.

## 2017-12-28 NOTE — Progress Notes (Signed)
Rehab admissions - patient is doing well and therapy recommendations are now [facility].  I will sign off for acute inpatient rehab at this time.  Call me for questions.  #[phone]
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
## 2020-01-04 IMAGING — RF DG C-ARM 61-120 MIN
1 series · 11 of 11 positions shown · non-contrast
Comparison: Prior CT from 12/20/2017

CLINICAL DATA: Intraoperative fluoroscopic views for ORIF of right
tibial plateau and right acetabulum.

EXAM:
DG C-ARM 61-120 MIN; RIGHT KNEE - COMPLETE 4+ VIEW; JUDET PELVIS -
3+ VIEW

[Series 1: run · 11 of 11 slices shown]
[im 1/11]
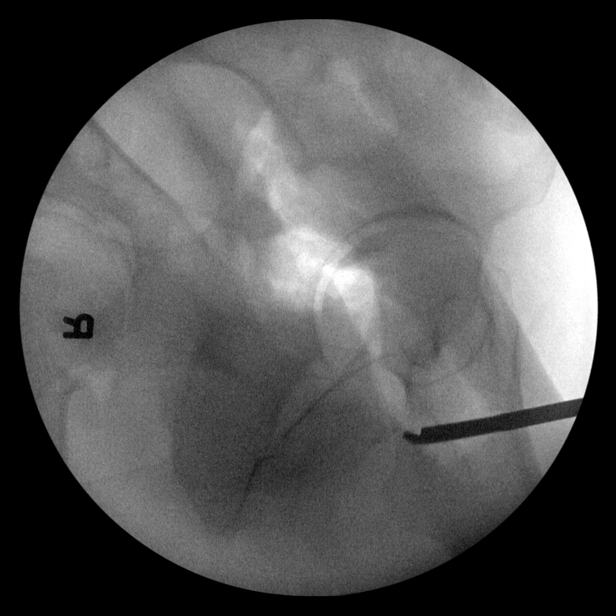
[im 2/11]
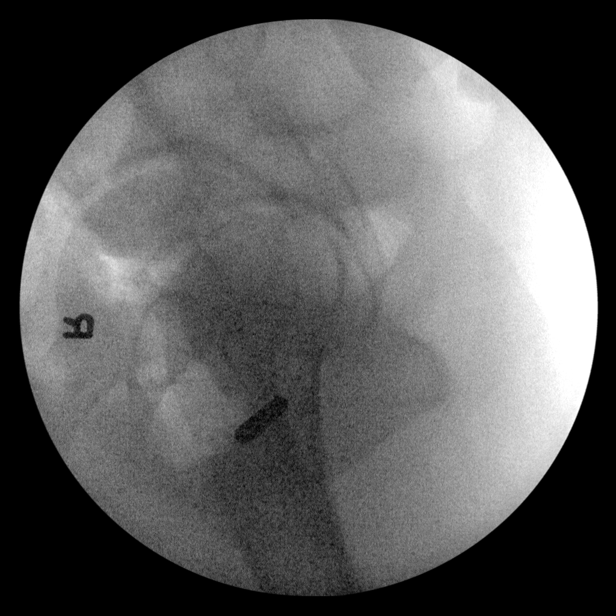
[im 3/11]
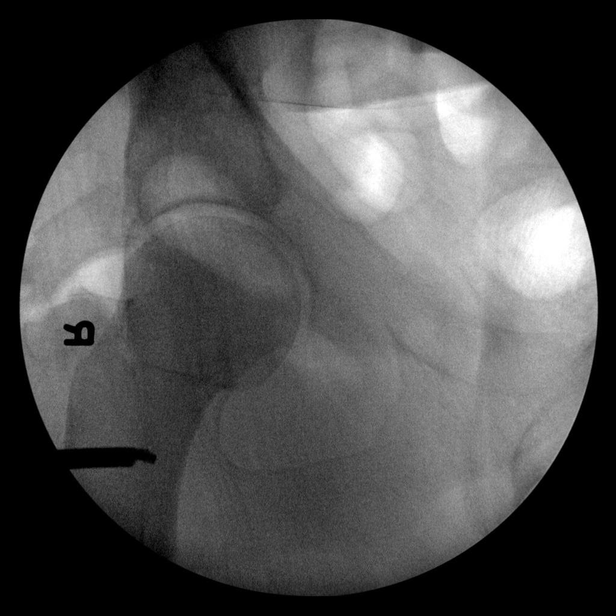
[im 4/11]
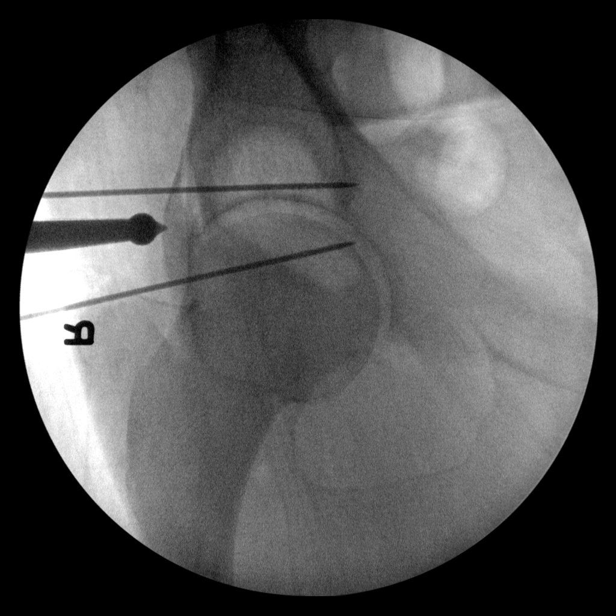
[im 5/11]
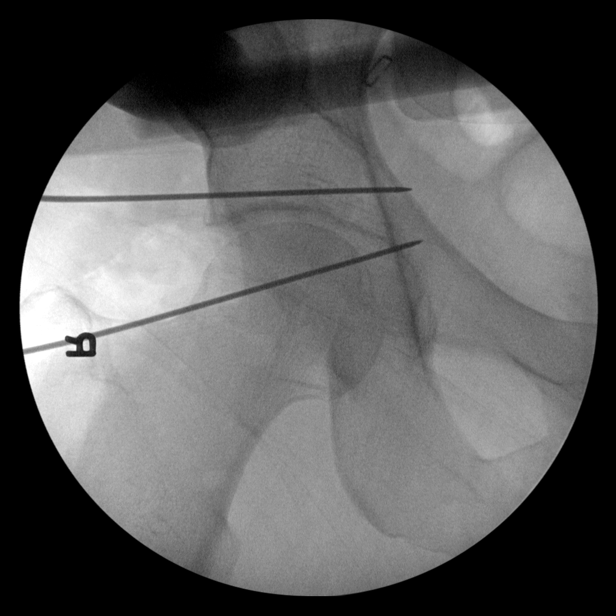
[im 6/11]
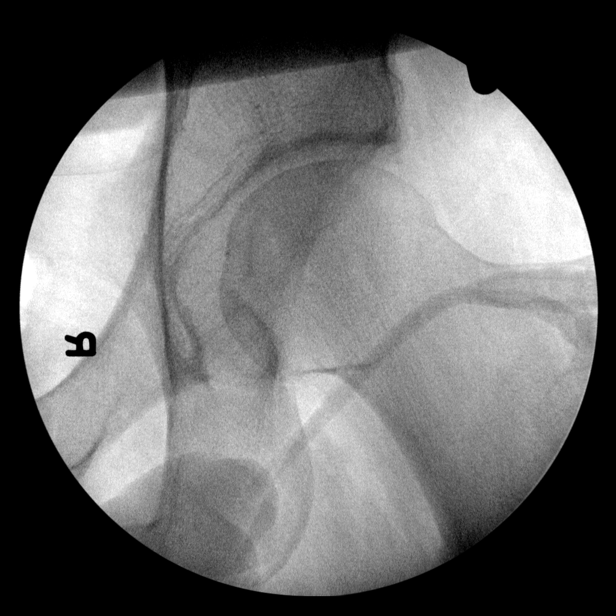
[im 7/11]
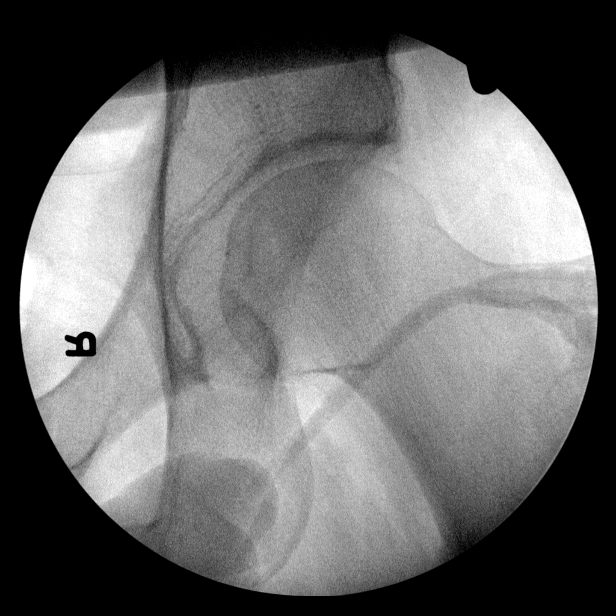
[im 8/11]
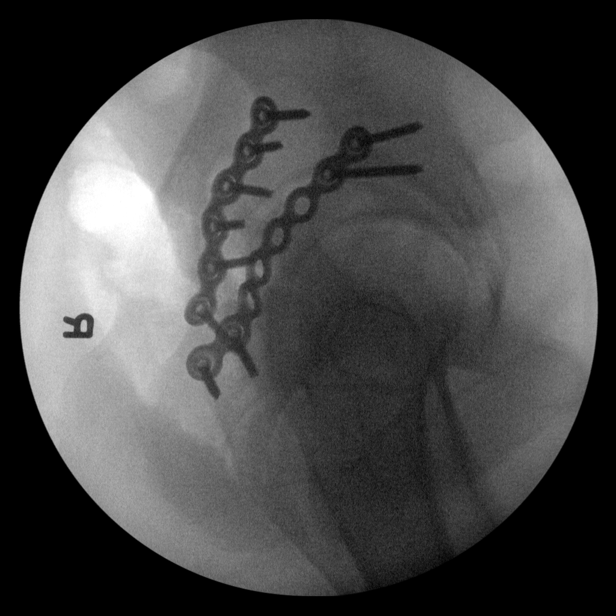
[im 9/11]
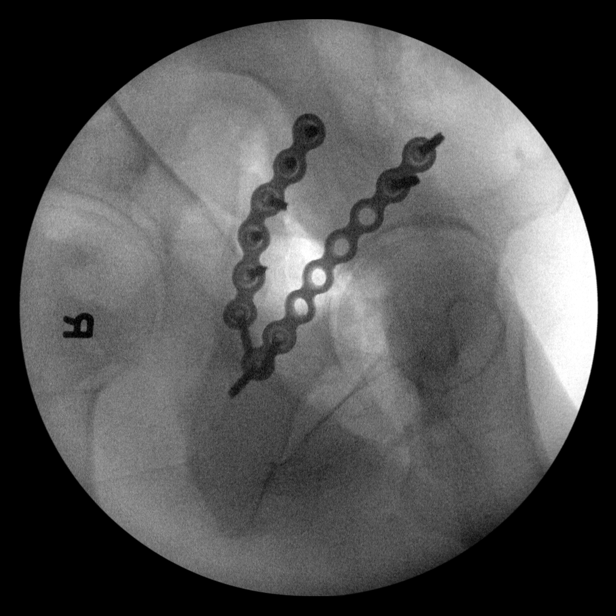
[im 10/11]
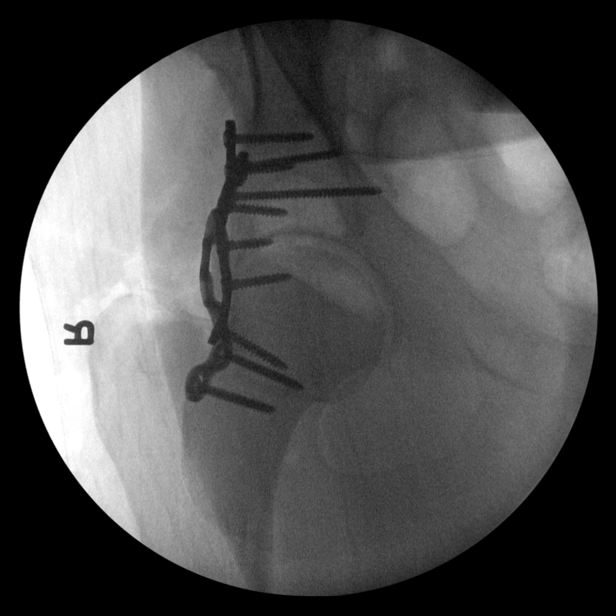
[im 11/11]
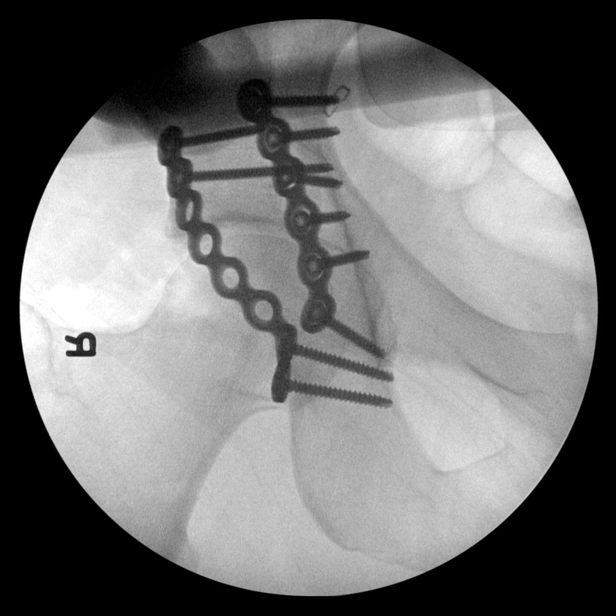

[11 of 11 positions shown; findings below may reference images not displayed]

FINDINGS: Multiple spot fluoroscopic intraoperative views for ORIF of
previously identified right acetabular and tibial plateau fractures
seen. Malleable plate screw fixation placed about the acetabular
fracture. No adverse features.

Additionally, there are intraoperative fluoroscopic views for ORIF
of the previously identified lateral tibial plateau fracture.
Malleable plate screw fixation placed at the proximal tibia.
Hardware appears well aligned without complication. Tibial plateau
fracture in anatomic alignment status post fixation.

Fluoroscopy time equals 47 seconds total.
IMPRESSION: Intraoperative fluoroscopic views for ORIF of right acetabular and
tibial plateau fractures without adverse features.

## 2020-01-10 IMAGING — DX DG CHEST 1V PORT
1 series · 1 of 1 positions shown · non-contrast
Comparison: 12/19/2017

CLINICAL DATA: Cough and congestion

EXAM:
PORTABLE CHEST 1 VIEW

[chest ap]
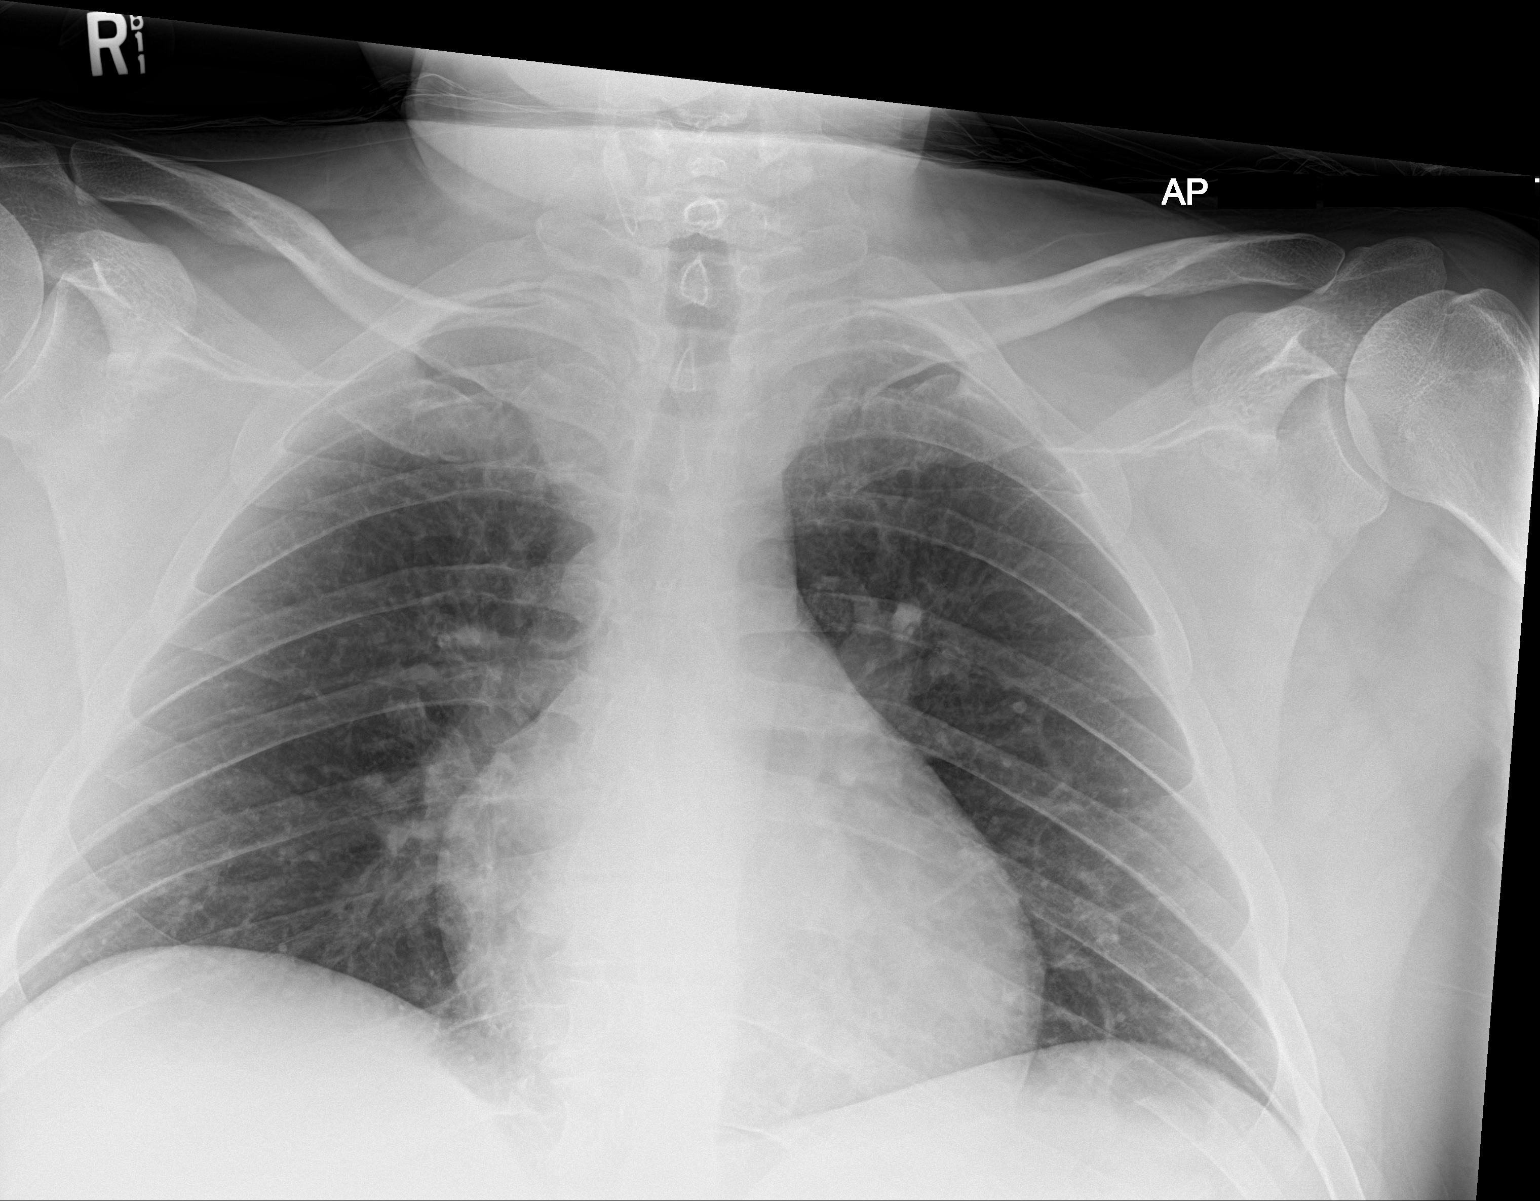

[1 of 1 positions shown; findings below may reference images not displayed]

FINDINGS: The heart size and mediastinal contours are within normal limits.
Both lungs are clear. The visualized skeletal structures are
unremarkable.
IMPRESSION: No active disease.

## 2022-10-12 ENCOUNTER — Other Ambulatory Visit: Payer: Self-pay | Admitting: General Practice

## 2022-10-12 ENCOUNTER — Ambulatory Visit
Admission: RE | Admit: 2022-10-12 | Discharge: 2022-10-12 | Disposition: A | Discharge status: Home / Self Care | Payer: No Typology Code available for payment source | Source: Ambulatory Visit | Attending: Nurse Practitioner | Admitting: Nurse Practitioner

## 2022-10-12 DIAGNOSIS — R0789 Other chest pain: Secondary | ICD-10-CM

## 2024-07-06 ENCOUNTER — Other Ambulatory Visit: Payer: Self-pay

## 2024-07-06 ENCOUNTER — Emergency Department (HOSPITAL_BASED_OUTPATIENT_CLINIC_OR_DEPARTMENT_OTHER)
Admission: EM | Admit: 2024-07-06 | Discharge: 2024-07-07 | Disposition: A | Discharge status: Home / Self Care | Attending: Emergency Medicine | Admitting: Emergency Medicine

## 2024-07-06 DIAGNOSIS — Z7901 Long term (current) use of anticoagulants: Secondary | ICD-10-CM | POA: Insufficient documentation

## 2024-07-06 DIAGNOSIS — W228XXA Striking against or struck by other objects, initial encounter: Secondary | ICD-10-CM | POA: Insufficient documentation

## 2024-07-06 DIAGNOSIS — S0181XA Laceration without foreign body of other part of head, initial encounter: Secondary | ICD-10-CM | POA: Insufficient documentation

## 2024-07-06 MED ORDER — LIDOCAINE-EPINEPHRINE-TETRACAINE (LET) TOPICAL GEL
3.0000 mL | Freq: Once | TOPICAL | Status: AC
  Administered 2024-07-06: 3 mL via TOPICAL
  Filled 2024-07-06: qty 3
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
## 2024-07-06 NOTE — ED Provider Notes (Deleted)
 Patient left without being seen   Daniel Franco, [place] 07/06/24 2246    Ruthe Cornet, DO 07/06/24 2306

## 2024-07-07 NOTE — Discharge Instructions (Signed)
Sutured repair Keep the laceration site dry for the next 24 hours and leave the dressing in place. After 24 hours you may remove the dressing and gently clean the laceration site with antibacterial soap and warm water. Do not scrub the area. Do not soak the area and water for long periods of time. Don't use hydrogen peroxide, iodine-based solutions, or alcohol, which can slow healing, and will probably be painful! Apply topical bacitracin 1-2 times per day for the next 3-5 days. Return to the emergency department in 5-7 days for removal of the sutures.  You should return sooner for any signs of infection which would include increased redness around the wound, increased swelling, new drainage of yellow pus.

## 2024-07-07 NOTE — ED Provider Notes (Signed)
 [place] EMERGENCY DEPARTMENT AT [facility] Provider Note   CSN: [id number] Arrival date & time: 07/06/24  2112     Patient presents with: Laceration   Daniel Franco is a 48 y.o. male.    Laceration  Patient with 3 cm laceration to right forehead after using a crowbar to apply leverage to a stuck screw/bolt.  Some part of the mechanism failed and he did have smacking of himself in the face with a crowbar.  He did not lose consciousness has not suffered any nausea or vomiting he is not on any anticoagulation.      Prior to Admission medications  Medication Sig Start Date End Date Taking? Authorizing Provider  docusate sodium  (COLACE) 100 MG capsule Take 1 capsule (100 mg total) by mouth 2 (two) times daily. 12/28/17   Deward Eck, PA-C  EPINEPHrine  (EPIPEN ) 0.3 mg/0.3 mL IJ SOAJ injection Inject 0.3 mLs (0.3 mg total) into the muscle as needed. 02/28/14   Patt Alm Macho, MD  HYDROcodone -acetaminophen  (NORCO) 10-325 MG tablet Take 1 tablet by mouth every 6 (six) hours as needed (breakthrough pain). 12/28/17   Deward Eck, PA-C  methocarbamol  (ROBAXIN ) 500 MG tablet Take 1-2 tablets (500-1,000 mg total) by mouth every 6 (six) hours as needed for muscle spasms. 12/28/17   Deward Eck, PA-C  morphine  (MS CONTIN ) 30 MG 12 hr tablet Take 1 tablet (30 mg total) by mouth every 12 (twelve) hours. 12/28/17   Deward Eck, PA-C  morphine  (MS CONTIN ) 30 MG 12 hr tablet Take 1 tablet (30 mg total) by mouth every 12 (twelve) hours. 01/04/18   Deward Eck, PA-C  polyethylene glycol (MIRALAX  / GLYCOLAX ) packet Take 17 g by mouth daily. 12/28/17   Deward Eck, PA-C  rivaroxaban  (XARELTO ) 20 MG TABS tablet Take 1 tablet (20 mg total) by mouth daily with supper. 01/18/18   Deward Eck, PA-C  Rivaroxaban  15 & 20 MG TBPK Take as directed on package: Start with one 15mg  tablet by mouth twice a day with food. On Day 22, switch to one 20mg  tablet once a day with food. 12/28/17   Deward Eck, PA-C     Allergies: Bee venom and Penicillins    Review of Systems  Updated Vital Signs BP (!) 149/100   Pulse 97   Temp 98 F (36.7 C) (Temporal)   Resp 19   Ht 6' 2 (1.88 m)   Wt 122.5 kg   SpO2 99%   BMI 34.67 kg/m   Physical Exam Vitals and nursing note reviewed.  Constitutional:      General: He is not in acute distress. HENT:     Head: Normocephalic.     Comments: 3 cm laceration over the right mid forehead Able to raise eyebrows symmetrically    Nose: Nose normal.     Mouth/Throat:     Mouth: Mucous membranes are moist.  Eyes:     General: No scleral icterus. Cardiovascular:     Rate and Rhythm: Normal rate and regular rhythm.     Pulses: Normal pulses.     Heart sounds: Normal heart sounds.  Pulmonary:     Effort: Pulmonary effort is normal. No respiratory distress.     Breath sounds: No wheezing.  Abdominal:     Palpations: Abdomen is soft.     Tenderness: There is no abdominal tenderness.  Musculoskeletal:     Cervical back: Normal range of motion.     Right lower leg: No edema.     Left lower  leg: No edema.  Skin:    General: Skin is warm and dry.     Capillary Refill: Capillary refill takes less than 2 seconds.  Neurological:     Mental Status: He is alert. Mental status is at baseline.  Psychiatric:        Mood and Affect: Mood normal.        Behavior: Behavior normal.     (all labs ordered are listed, but only abnormal results are displayed) Labs Reviewed - No data to display  EKG: None  Radiology: No results found.   .Laceration Repair  Date/Time: 07/07/2024 12:31 AM  Performed by: Neldon Hamp RAMAN, PA Authorized by: Neldon Hamp RAMAN, PA   Consent:    Consent obtained:  Verbal   Consent given by:  Parent and patient   Risks discussed:  Infection, pain, poor cosmetic result and poor wound healing Universal protocol:    Procedure explained and questions answered to patient or proxy's satisfaction: yes     Relevant documents  present and verified: yes     Test results available: yes     Imaging studies available: yes     Required blood products, implants, devices, and special equipment available: yes     Site/side marked: yes     Immediately prior to procedure, a time out was called: yes     Patient identity confirmed:  Verbally with patient and arm band Laceration details:    Length (cm):  3 Exploration:    Hemostasis achieved with:  LET   Imaging outcome: foreign body not noted     Wound extent: areolar tissue violated     Wound extent: fascia not violated, no foreign body and no signs of injury     Contaminated: no   Treatment:    Amount of cleaning:  Standard   Irrigation solution:  Sterile saline   Irrigation volume:  Copious   Visualized foreign bodies/material removed: no   Skin repair:    Repair method:  Sutures   Suture size:  5-0   Suture material:  Prolene   Suture technique:  Simple interrupted   Number of sutures:  2 Approximation:    Approximation:  Close Repair type:    Repair type:  Intermediate Post-procedure details:    Dressing:  Antibiotic ointment   Procedure completion:  Tolerated    Medications Ordered in the ED  lidocaine -EPINEPHrine -tetracaine (LET) topical gel (3 mLs Topical Given by Other 07/06/24 2332)                                    Medical Decision Making   Patient with 3 cm laceration to right forehead after using a crowbar to apply leverage to a stuck screw/bolt.  Some part of the mechanism failed and he did have smacking of himself in the face with a crowbar.  He did not lose consciousness has not suffered any nausea or vomiting he is not on any anticoagulation.  [place] head CT negative.  2 stitches placed in forehead.   Final diagnoses:  Laceration of forehead, initial encounter    ED Discharge Orders     None          Neldon Hamp RAMAN, [place] 07/07/24 0032    Ruthe Cornet, DO 07/07/24 1520
